# Patient Record
Sex: Female | Born: 1990 | Race: Black or African American | Hispanic: No | Marital: Single | Smoking: Never smoker
Health system: Southern US, Community
[De-identification: ages and names within clinical notes are randomized; demographics above are authoritative.]

## PROBLEM LIST (undated history)

## (undated) DIAGNOSIS — J45909 Unspecified asthma, uncomplicated: Secondary | ICD-10-CM

## (undated) DIAGNOSIS — E119 Type 2 diabetes mellitus without complications: Secondary | ICD-10-CM

## (undated) HISTORY — PX: ADENOIDECTOMY: SUR15

## (undated) HISTORY — PX: TONSILLECTOMY: SUR1361

## (undated) HISTORY — PX: BACK SURGERY: SHX140

---
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
## 2011-01-19 ENCOUNTER — Encounter: Payer: Self-pay | Admitting: Obstetrics

## 2011-04-08 LAB — DIFFERENTIAL
Eosinophils Absolute: 0.3 10*3/uL (ref 0.0–1.2)
Eosinophils Relative: 5 % (ref 0–5)
Monocytes Absolute: 0.2 10*3/uL (ref 0.2–1.2)

## 2011-04-08 LAB — CBC
HCT: 37.6 % (ref 36.0–49.0)
Hemoglobin: 12.3 g/dL (ref 12.0–16.0)
MCHC: 32.8 g/dL (ref 31.0–37.0)
MCV: 69.1 fL — ABNORMAL LOW (ref 78.0–98.0)
Platelets: 349 10*3/uL (ref 150–400)
RBC: 5.44 MIL/uL (ref 3.80–5.70)
RDW: 15.5 % (ref 11.4–15.5)
WBC: 6.7 10*3/uL (ref 4.5–13.5)

## 2011-04-08 LAB — POCT URINALYSIS DIP (DEVICE)
Glucose, UA: NEGATIVE mg/dL
Protein, ur: NEGATIVE mg/dL
Specific Gravity, Urine: 1.02 (ref 1.005–1.030)

## 2011-04-08 LAB — POCT PREGNANCY, URINE: Preg Test, Ur: NEGATIVE

## 2011-05-13 NOTE — Op Note (Signed)
NAMEGEORGANN, Sheila Alvarez              ACCOUNT NO.:  [id number]   MEDICAL RECORD NO.:  [id number]          PATIENT TYPE:  OIB   LOCATION:  2550                         FACILITY:  [facility]   PHYSICIAN:  Lucky Cowboy, MD         DATE OF BIRTH:  January 11, 1991   DATE OF PROCEDURE:  10/01/2007  DATE OF DISCHARGE:                               OPERATIVE REPORT   PREOPERATIVE DIAGNOSIS:  Obstructive sleep apnea, obstructing inferior  turbinate hypertrophy, adenotonsillar hypertrophy.   POSTOPERATIVE DIAGNOSIS:  Obstructive sleep apnea, obstructing inferior  turbinate hypertrophy, adenotonsillar hypertrophy.   PROCEDURE:  Adenotonsillectomy, submucosal resection bilateral inferior  turbinates.   SURGEON:  Lucky Cowboy, M.D.   ANESTHESIA:  General.   ESTIMATED BLOOD LOSS:  Less than 30 mL.   SPECIMENS:  Tonsils and adenoids.   COMPLICATIONS:  None.   INDICATIONS:  The patient is a 20 year old female who was having  constant nasal obstruction with profuse clear rhinorrhea.  She is going  through three boxes of Kleenex per day.  Although she is overweight at  250 pounds, which certainly could contribute to the obstructive sleep  apnea, she also has significant adenotonsillar hypertrophy.  For these  reasons, adenotonsillectomy along with turbinate reduction were  recommended.   FINDINGS:  The patient was noted have a moderate amount of adenoid  hypertrophy with deep seated exudates.  Tonsils were 3+ with deep  exudate, as well, in the cryptic spaces.  There was very profuse  inferior middle turbinate edema consistent with severe allergic rhinitis  or some type of rhinitis.  There is no evidence of infection.  There was  a significant bony component to both of the inferior turbinates.   OPERATIVE PROCEDURE:  The patient was taken to the operating room and  placed on the table in the supine position.  She was then placed under  general endotracheal anesthesia and the table rotated counter  clockwise  90 degrees.  Her head and body were draped.  A Crowe-Davis mouth gag  with a #4 tongue blade was then placed intraorally, opened, and  suspended on the Mayo stand.  Palpation of the soft palate was without  evidence of submucosal cleft.  A red rubber catheter was placed down the  left nostril, brought out through the oral cavity, and secured in place  with a hemostat.  A large adenoid curet was placed against the vomer and  directed inferiorly with subsequent passes severing the adenoid pad.  Two sterile gauze Afrin soaked packs were placed in the nasopharynx and  the palate relaxed.   The right palatine tonsil was grasped with Allis clamps and directed  inferomedially while the Bovie cautery was used to dissect the tonsil  away from the superior pharyngeal constrictor.  The left palatine tonsil  was removed in an identical fashion.  The palate was re-elevated and the  packs removed.  Suction cautery was used for hemostasis.  The  nasopharynx was copiously irrigated with normal saline which was  suctioned out through an oral cavity.  An NG tube was placed down the  esophagus for suctioning  of the gastric contents.  The mouth gag was  removed noting no damage to the teeth or soft tissues.   The table was rotated clockwise 90 degrees and inferior turbinectomy  performed.  Both nasal cavities were decongested with Afrin on cottonoid  pledgets.  After allowing time for vasoconstrictive effect, which was  quite significant, 1% lidocaine with 1:100,000 epinephrine was used to  inject both the inferior turbinates.  Again allowing time for medication  effect, the straight shot debrider was used to perform a lateral power  turbinectomy by removing the mucosa on the lateral side of the inferior  turbinate throughout its length and also removing the inferior turbinate  bone.  This was done bilaterally and the residual soft tissue portion of  the turbinate rolled around under the remnant  of the bone to promote  normal healing.  Excessive bone which was residual prior to this was  taken down using Tru-Cut forceps.  Surgicel was placed to help the  mucosa stay in the desired position.  Each of the nasal cavities was  packed with Telfa coated with bacitracin ointment.  The packs were tied  to one another anterior to the columella.   An NG tube was placed down the esophagus for suctioning of the gastric  contents.  The mouth gag was removed noting no damage to the teeth or  soft tissues.  The patient was awakened from anesthesia and taken to the  post anesthesia care unit in stable condition.  There were no  complications.  She will be observed overnight on the pediatric floor.      Lucky Cowboy, MD  Electronically Signed     SJ/MEDQ  D:  10/01/2007  T:  10/02/2007  Job:  [id number]   cc:   Fonnie Mu, M.D.  Roselyn M. Willa Rough, M.D.

## 2011-10-09 LAB — CBC: MCHC: 32.7

## 2012-07-08 ENCOUNTER — Encounter (HOSPITAL_COMMUNITY): Payer: Self-pay | Admitting: Emergency Medicine

## 2012-07-08 ENCOUNTER — Emergency Department (HOSPITAL_COMMUNITY): Payer: Self-pay

## 2012-07-08 ENCOUNTER — Emergency Department (HOSPITAL_COMMUNITY)
Admission: EM | Admit: 2012-07-08 | Discharge: 2012-07-08 | Disposition: A | Discharge status: Home / Self Care | Payer: Self-pay | Attending: Emergency Medicine | Admitting: Emergency Medicine

## 2012-07-08 DIAGNOSIS — M25569 Pain in unspecified knee: Secondary | ICD-10-CM | POA: Insufficient documentation

## 2012-07-08 DIAGNOSIS — M25561 Pain in right knee: Secondary | ICD-10-CM

## 2012-07-08 MED ORDER — NAPROXEN 500 MG PO TABS: 500.0000 mg | ORAL_TABLET | Freq: Two times a day (BID) | ORAL | Status: DC

## 2012-07-08 NOTE — ED Notes (Signed)
Pt reports knee pain x 1 week. Denies trauma. Did not self medicate

## 2012-07-08 NOTE — ED Provider Notes (Signed)
Medical screening examination/treatment/procedure(s) were performed by non-physician practitioner and as supervising physician I was immediately available for consultation/collaboration.  Waynesha Rammel K Linker, MD 07/08/12 1611 

## 2012-07-08 NOTE — ED Provider Notes (Signed)
History     CSN: [id number]  Arrival date & time 07/08/12  1328   First MD Initiated Contact with Patient 07/08/12 1346      Chief Complaint  Patient presents with  . Knee Pain    r/knee pain x 1 week. denies trauma    (Consider location/radiation/quality/duration/timing/severity/associated sxs/prior treatment) Patient is a 21 y.o. female presenting with knee pain. The history is provided by the patient.  Knee Pain This is a new problem. The current episode started 1 to 4 weeks ago. The problem occurs constantly. The problem has been gradually worsening. Pertinent negatives include no arthralgias, chills, fever, joint swelling, numbness, rash or weakness. The symptoms are aggravated by bending and standing. She has tried nothing for the symptoms.  Pt states pain mainly behind the knee. No swelling or pain in the foot or calf.   No past medical history on file.  No past surgical history on file.  No family history on file.  History  Substance Use Topics  . Smoking status: Not on file  . Smokeless tobacco: Not on file  . Alcohol Use: Not on file    OB History    No data available      Review of Systems  Constitutional: Negative for fever and chills.  Respiratory: Negative.   Cardiovascular: Negative.   Musculoskeletal: Negative for joint swelling and arthralgias.  Skin: Negative for color change and rash.  Neurological: Negative for weakness and numbness.    Allergies  Review of patient's allergies indicates no known allergies.  Home Medications  No current outpatient prescriptions on file.  There were no vitals taken for this visit.  Physical Exam  Nursing note and vitals reviewed. Constitutional: She is oriented to person, place, and time. She appears well-developed and well-nourished. No distress.  Eyes: Conjunctivae are normal.  Cardiovascular: Normal rate, regular rhythm and normal heart sounds.   Pulmonary/Chest: Effort normal and breath sounds normal.  No respiratory distress. She has no wheezes. She has no rales.  Musculoskeletal:       Normal appearing right knee with no bruising, swelling, redness. Tender to the posterior knee. Full ROM. Pain with full extension and full flexion. No pain in the ankle, calf, foot. Joint stable with negative anterior, posterior drawer sign. No laxity or pain with medial or latera stress  Neurological: She is alert and oriented to person, place, and time.  Skin: Skin is warm and dry.  Psychiatric: She has a normal mood and affect.    ED Course  Procedures (including critical care time)  Pt with non traumatic knee pain for 1 week, did not take any medications.   Dg Knee Complete 4 Views Right  07/08/2012  *RADIOLOGY REPORT*  Clinical Data: Posterior right knee pain for 1 week, no known injury  RIGHT KNEE - COMPLETE 4+ VIEW  Comparison: None  Findings: Osseous mineralization grossly normal for technique. Question minimal medial compartment joint space narrowing. No acute fracture, dislocation or bone destruction. No definite knee joint effusion. Regional soft tissues unremarkable.  IMPRESSION: No acute osseous abnormalities. Question minimal medial compartment joint space narrowing.  Original Report Authenticated By: Lollie Marrow, M.D.   X-ray negative. Pt in NAD. No signs of infection. I got a Child psychotherapist to come speak with pt about getting a primary care doctor, resources given. Will d/c home with ACE wrap, anti inflammatories, follow up.   1. Knee pain, right       MDM  Lottie Mussel, [place] 07/08/12 [phone]

## 2012-12-18 ENCOUNTER — Emergency Department (HOSPITAL_COMMUNITY)
Admission: EM | Admit: 2012-12-18 | Discharge: 2012-12-18 | Disposition: A | Discharge status: Home / Self Care | Payer: Self-pay | Attending: Emergency Medicine | Admitting: Emergency Medicine

## 2012-12-18 ENCOUNTER — Encounter (HOSPITAL_COMMUNITY): Payer: Self-pay | Admitting: Emergency Medicine

## 2012-12-18 DIAGNOSIS — H9190 Unspecified hearing loss, unspecified ear: Secondary | ICD-10-CM | POA: Insufficient documentation

## 2012-12-18 DIAGNOSIS — J3489 Other specified disorders of nose and nasal sinuses: Secondary | ICD-10-CM | POA: Insufficient documentation

## 2012-12-18 DIAGNOSIS — H9209 Otalgia, unspecified ear: Secondary | ICD-10-CM | POA: Insufficient documentation

## 2012-12-18 DIAGNOSIS — H6690 Otitis media, unspecified, unspecified ear: Secondary | ICD-10-CM | POA: Insufficient documentation

## 2012-12-18 DIAGNOSIS — H9319 Tinnitus, unspecified ear: Secondary | ICD-10-CM | POA: Insufficient documentation

## 2012-12-18 MED ORDER — AMOXICILLIN 500 MG PO CAPS: 500.0000 mg | ORAL_CAPSULE | Freq: Three times a day (TID) | ORAL | Status: DC

## 2012-12-18 NOTE — ED Provider Notes (Signed)
History     CSN: [id number]  Arrival date & time 12/18/12  0865   First MD Initiated Contact with Patient 12/18/12 [phone]      Chief Complaint  Patient presents with  . Ear Fullness    (Consider location/radiation/quality/duration/timing/severity/associated sxs/prior treatment) HPI Comments: Patient presents with full feeling to the left ear with some remaining in the ear. She states it started about 3 days ago with some pain to the left ear and she was using sweet oil and had flushed her ear out and now she feels fullness and ringing in her left ear. She has had some recent URI type symptoms but denies any congestion currently. Denies any fevers or chills. Denies any drainage from her ear.  Denies known trauma to ear.  Patient is a 21 y.o. female presenting with plugged ear sensation.  Ear Fullness Pertinent negatives include no chest pain, no abdominal pain, no headaches and no shortness of breath.    History reviewed. No pertinent past medical history.  Past Surgical History  Procedure Date  . Back surgery   . Tonsillectomy   . Adenoidectomy     Family History  Problem Relation Age of Onset  . Diabetes Mother   . Hypertension Mother   . Cancer Mother     History  Substance Use Topics  . Smoking status: Never Smoker   . Smokeless tobacco: Never Used  . Alcohol Use: Yes     Comment: ocassionally - vodka    OB History    Grav Para Term Preterm Abortions TAB SAB Ect Mult Living                  Review of Systems  Constitutional: Negative for fever, chills, diaphoresis and fatigue.  HENT: Positive for hearing loss, ear pain, congestion, rhinorrhea, postnasal drip and tinnitus. Negative for sneezing.   Eyes: Negative.   Respiratory: Negative for cough, chest tightness and shortness of breath.   Cardiovascular: Negative for chest pain and leg swelling.  Gastrointestinal: Negative for nausea, vomiting, abdominal pain, diarrhea and blood in stool.  Genitourinary:  Negative for frequency, hematuria, flank pain and difficulty urinating.  Musculoskeletal: Negative for back pain and arthralgias.  Skin: Negative for rash.  Neurological: Negative for dizziness, speech difficulty, weakness, numbness and headaches.    Allergies  Review of patient's allergies indicates no known allergies.  Home Medications   Current Outpatient Rx  Name  Route  Sig  Dispense  Refill  . ACETAMINOPHEN 500 MG PO TABS   Oral   Take 1,000 mg by mouth every 6 (six) hours as needed. For pain.         Marland Kitchen AMOXICILLIN 500 MG PO CAPS   Oral   Take 1 capsule (500 mg total) by mouth 3 (three) times daily.   21 capsule   0     BP 112/74  Pulse 85  Temp 97.8 F (36.6 C) (Oral)  Resp 16  SpO2 99%  LMP 12/04/2012  Physical Exam  Constitutional: She is oriented to person, place, and time. She appears well-developed and well-nourished.  HENT:  Head: Normocephalic and atraumatic.  Mouth/Throat: Oropharynx is clear and moist.       Left ear with some clear fluid in canal, no inflammation of canal.  No pain over mastoid/tragus.  No elevation of pinna.  No rash.  +erythema to TM, I cannot see entire TM due to wax  Eyes: Pupils are equal, round, and reactive to light.  Neck: Normal range  of motion. Neck supple.  Cardiovascular: Normal rate.   Pulmonary/Chest: Effort normal and breath sounds normal. No respiratory distress.  Musculoskeletal: Normal range of motion. She exhibits no edema.  Lymphadenopathy:    She has no cervical adenopathy.  Neurological: She is alert and oriented to person, place, and time.  Skin: Skin is warm and dry. No rash noted.  Psychiatric: She has a normal mood and affect.    ED Course  Procedures (including critical care time)  Labs Reviewed - No data to display No results found.   1. Otitis media       MDM  Pt with OM, cannot completely evaluate TM for rupture. Will start amoxil, advised pt to avoid getting water in ear.  Advised pt  that she will need to have a reevaluation to assess TM completely and hearing test if hearing does not resolve.  Will refer to ENT        Rolan Bucco, MD 12/18/12 [phone]

## 2012-12-18 NOTE — ED Notes (Signed)
Pt presents w/ inability to hear out of left ear, initially had left ear pain which she treated w/ OTC, Sweet Oil. No longer has pain in ear, states ear is ringing.

## 2013-12-16 ENCOUNTER — Emergency Department (HOSPITAL_COMMUNITY)
Admission: EM | Admit: 2013-12-16 | Discharge: 2013-12-16 | Disposition: A | Discharge status: Home / Self Care | Payer: Self-pay | Attending: Emergency Medicine | Admitting: Emergency Medicine

## 2013-12-16 ENCOUNTER — Encounter (HOSPITAL_COMMUNITY): Payer: Self-pay | Admitting: Emergency Medicine

## 2013-12-16 DIAGNOSIS — R110 Nausea: Secondary | ICD-10-CM | POA: Insufficient documentation

## 2013-12-16 DIAGNOSIS — R079 Chest pain, unspecified: Secondary | ICD-10-CM | POA: Insufficient documentation

## 2013-12-16 DIAGNOSIS — Z9889 Other specified postprocedural states: Secondary | ICD-10-CM | POA: Insufficient documentation

## 2013-12-16 DIAGNOSIS — M546 Pain in thoracic spine: Secondary | ICD-10-CM | POA: Insufficient documentation

## 2013-12-16 DIAGNOSIS — R0781 Pleurodynia: Secondary | ICD-10-CM

## 2013-12-16 DIAGNOSIS — J45901 Unspecified asthma with (acute) exacerbation: Secondary | ICD-10-CM | POA: Insufficient documentation

## 2013-12-16 DIAGNOSIS — M545 Low back pain, unspecified: Secondary | ICD-10-CM | POA: Insufficient documentation

## 2013-12-16 DIAGNOSIS — G8929 Other chronic pain: Secondary | ICD-10-CM | POA: Insufficient documentation

## 2013-12-16 HISTORY — DX: Unspecified asthma, uncomplicated: J45.909

## 2013-12-16 MED ORDER — IBUPROFEN 600 MG PO TABS: 600.0000 mg | ORAL_TABLET | Freq: Four times a day (QID) | ORAL | Status: DC | PRN

## 2013-12-16 MED ORDER — IBUPROFEN 400 MG PO TABS
800.0000 mg | ORAL_TABLET | Freq: Once | ORAL | Status: AC
  Administered 2013-12-16: 800 mg via ORAL
  Filled 2013-12-16: qty 2

## 2013-12-16 MED ORDER — CYCLOBENZAPRINE HCL 10 MG PO TABS: 10.0000 mg | ORAL_TABLET | Freq: Two times a day (BID) | ORAL | Status: DC | PRN

## 2013-12-16 MED ORDER — TRAMADOL HCL 50 MG PO TABS
50.0000 mg | ORAL_TABLET | Freq: Once | ORAL | Status: AC
  Administered 2013-12-16: 50 mg via ORAL
  Filled 2013-12-16: qty 1

## 2013-12-16 MED ORDER — DIAZEPAM 5 MG PO TABS
5.0000 mg | ORAL_TABLET | Freq: Once | ORAL | Status: AC
  Administered 2013-12-16: 5 mg via ORAL
  Filled 2013-12-16: qty 1

## 2013-12-16 NOTE — ED Notes (Signed)
Lower back pain   For  A few years , rib pain both side under both breast started last night denies any injury no cough

## 2013-12-16 NOTE — ED Provider Notes (Signed)
Medical screening examination/treatment/procedure(s) were performed by non-physician practitioner and as supervising physician I was immediately available for consultation/collaboration.  EKG Interpretation   None         Gwyneth Sprout, MD 12/16/13 1529

## 2013-12-16 NOTE — ED Notes (Signed)
Pt reports upper back pain ongoing for some time. Pt reports B/L rib pain onset past couple days. Pt denies cold symptoms or heavy lifting.

## 2013-12-16 NOTE — ED Provider Notes (Signed)
CSN: [id number]     Arrival date & time 12/16/13  4098 History  This chart was scribed for non-physician practitioner Junius Finner, PA-C, working with Gwyneth Sprout, MD by Dorothey Baseman, ED Scribe. This patient was seen in room TR08C/TR08C and the patient's care was started at 9:52 AM.    Chief Complaint  Patient presents with  . Back Pain  . Chest Pain   The history is provided by the patient. No language interpreter was used.   HPI Comments: Sheila Alvarez is a 22 y.o. female who presents to the Emergency Department complaining of an intermittent pain to the upper back, 7/10 currently, that has been ongoing for a while secondary to a back surgery that was performed about 4-5 years ago. She reports that the pain has been progressively worsening for the past few weeks. She reports some associated shortness of breath and nausea secondary to the pain. Patient denies any recent potential injury or trauma to the area or recent heavy lifting. She reports applying heat to the area and taking Tylenol at home without relief. She denies any neck pain, numbness, weakness, bowel or bladder incontinence, fever, emesis, or diarrhea. Patient also reports a constant pain to the bilateral ribs onset a few days ago. She denies any allergies to medications. Patient has no other pertinent medical history.    Past Medical History  Diagnosis Date  . Asthma    Past Surgical History  Procedure Laterality Date  . Back surgery    . Tonsillectomy    . Adenoidectomy     Family History  Problem Relation Age of Onset  . Diabetes Mother   . Hypertension Mother   . Cancer Mother    History  Substance Use Topics  . Smoking status: Never Smoker   . Smokeless tobacco: Never Used  . Alcohol Use: Yes     Comment: ocassionally - vodka   OB History   Grav Para Term Preterm Abortions TAB SAB Ect Mult Living                 Review of Systems  Constitutional: Negative for fever.  Respiratory: Positive for  shortness of breath.        Rib pain  Gastrointestinal: Positive for nausea. Negative for vomiting and diarrhea.  Musculoskeletal: Positive for back pain. Negative for neck pain.  Neurological: Negative for weakness and numbness.  All other systems reviewed and are negative.    Allergies  Review of patient's allergies indicates no known allergies.  Home Medications   Current Outpatient Rx  Name  Route  Sig  Dispense  Refill  . acetaminophen (TYLENOL) 500 MG tablet   Oral   Take 1,000 mg by mouth every 6 (six) hours as needed. For pain.         . cyclobenzaprine (FLEXERIL) 10 MG tablet   Oral   Take 1 tablet (10 mg total) by mouth 2 (two) times daily as needed for muscle spasms.   15 tablet   0   . ibuprofen (ADVIL,MOTRIN) 600 MG tablet   Oral   Take 1 tablet (600 mg total) by mouth every 6 (six) hours as needed.   30 tablet   0    Triage Vitals: BP 148/86  Pulse 82  Temp(Src) 98 F (36.7 C) (Oral)  Resp 20  Ht 5\' 4"  (1.626 m)  Wt 278 lb 9 oz (126.355 kg)  BMI 47.79 kg/m2  SpO2 100%  LMP 12/02/2013  Physical Exam  Nursing note  and vitals reviewed. Constitutional: She is oriented to person, place, and time. She appears well-developed and well-nourished.  Morbidly obese.  HENT:  Head: Normocephalic and atraumatic.  Eyes: EOM are normal.  Neck: Normal range of motion.  Cardiovascular: Normal rate.   Pulmonary/Chest: Effort normal. She exhibits tenderness.  Chest wall tenderness along the bilateral lower ribs. No crepitus.   Musculoskeletal: Normal range of motion.  Tender along the thoracic and lumbar paraspinal muscles.   Neurological: She is alert and oriented to person, place, and time.  Antalgic gait. No ataxia.  Skin: Skin is warm and dry. No erythema.  Well-healed surgical scar over lumbar spine. No erythema or warmth.   Psychiatric: She has a normal mood and affect. Her behavior is normal.    ED Course  Procedures (including critical care  time)  DIAGNOSTIC STUDIES: Oxygen Saturation is 100% on room air, normal by my interpretation.    COORDINATION OF CARE: 9:56 PM- Will discharge patient with flexeril and ibuprofen to manage symptoms. Advised patient to follow up with the referred orthopedic specialist. Return precautions given. Discussed treatment plan with patient at bedside and patient verbalized agreement.     Labs Review Labs Reviewed - No data to display Imaging Review No results found.  EKG Interpretation   None       MDM   1. Rib pain   2. Chronic back pain    Pt is morbidly obese, remote hx of spinal surgery, 4-5 years ago. Reports pain since time of surgery, recently worsened, in process of establishing insurance and f/u with orthopedist.  No red flag symptoms.  Denies fever, n/v/d, change in bowel or bladder habits. No numbness or tingling in legs. No ataxia. Denies hx of trauma or fall. Do not believe imaging would be of benefit at this time.  Will tx pain in ED. Return precautions provided.   I personally performed the services described in this documentation, which was scribed in my presence. The recorded information has been reviewed and is accurate.     Junius Finner, PA-C 12/16/13 1042

## 2014-06-30 ENCOUNTER — Emergency Department (HOSPITAL_COMMUNITY)
Admission: EM | Admit: 2014-06-30 | Discharge: 2014-06-30 | Disposition: A | Discharge status: Home / Self Care | Payer: Self-pay | Attending: Emergency Medicine | Admitting: Emergency Medicine

## 2014-06-30 ENCOUNTER — Encounter (HOSPITAL_COMMUNITY): Payer: Self-pay | Admitting: Emergency Medicine

## 2014-06-30 DIAGNOSIS — R51 Headache: Secondary | ICD-10-CM | POA: Insufficient documentation

## 2014-06-30 DIAGNOSIS — R519 Headache, unspecified: Secondary | ICD-10-CM

## 2014-06-30 DIAGNOSIS — J45909 Unspecified asthma, uncomplicated: Secondary | ICD-10-CM | POA: Insufficient documentation

## 2014-06-30 DIAGNOSIS — M436 Torticollis: Secondary | ICD-10-CM | POA: Insufficient documentation

## 2014-06-30 MED ORDER — OXYCODONE-ACETAMINOPHEN 5-325 MG PO TABS
1.0000 | ORAL_TABLET | Freq: Once | ORAL | Status: AC
  Administered 2014-06-30: 1 via ORAL
  Filled 2014-06-30: qty 1

## 2014-06-30 MED ORDER — IBUPROFEN 800 MG PO TABS
800.0000 mg | ORAL_TABLET | Freq: Once | ORAL | Status: AC
  Administered 2014-06-30: 800 mg via ORAL
  Filled 2014-06-30: qty 1

## 2014-06-30 MED ORDER — IBUPROFEN 600 MG PO TABS: 600.0000 mg | ORAL_TABLET | Freq: Four times a day (QID) | ORAL | Status: DC | PRN

## 2014-06-30 MED ORDER — DIAZEPAM 5 MG PO TABS
5.0000 mg | ORAL_TABLET | Freq: Once | ORAL | Status: AC
  Administered 2014-06-30: 5 mg via ORAL
  Filled 2014-06-30: qty 1

## 2014-06-30 NOTE — Discharge Instructions (Signed)
Return to the ED with any concerns including fever/chills, vomiting and not able to keep down liquids, changes in vision or speech, weakness of arm or leg, decreased level of alertness/lethargy, or any other alarming symptoms

## 2014-06-30 NOTE — ED Notes (Signed)
Pt c/o "pressure" to back of head x 3 days. Denies n/v/d, denies fever. +photophobia.

## 2014-06-30 NOTE — ED Provider Notes (Signed)
CSN: [id number]     Arrival date & time 06/30/14  2027 History   First MD Initiated Contact with Patient 06/30/14 2108     Chief Complaint  Patient presents with  . Headache     (Consider location/radiation/quality/duration/timing/severity/associated sxs/prior Treatment) HPI Pt presenting with c/o headache and right sided neck pain.  Pt states she woke up with pain when turning her head to the right.  She states this is causing a throbbing headache up the back of her head.  No fever/chills.  No vomiting.  No changes in vision.  Pt describes pain as gradual in onset.  No focal weakness or numnbess.  LMP 1 week ago.  No hx of DVT/PE, no recent pregnancy.  There are no other associated systemic symptoms, there are no other alleviating or modifying factors.   Past Medical History  Diagnosis Date  . Asthma    Past Surgical History  Procedure Laterality Date  . Back surgery    . Tonsillectomy    . Adenoidectomy     Family History  Problem Relation Age of Onset  . Diabetes Mother   . Hypertension Mother   . Cancer Mother    History  Substance Use Topics  . Smoking status: Never Smoker   . Smokeless tobacco: Never Used  . Alcohol Use: Yes     Comment: ocassionally - vodka   OB History   Grav Para Term Preterm Abortions TAB SAB Ect Mult Living                 Review of Systems ROS reviewed and all otherwise negative except for mentioned in HPI    Allergies  Review of patient's allergies indicates no known allergies.  Home Medications   Prior to Admission medications   Medication Sig Start Date End Date Taking? Authorizing Provider  ibuprofen (ADVIL,MOTRIN) 600 MG tablet Take 1 tablet (600 mg total) by mouth every 6 (six) hours as needed. 12/16/13  Yes Junius FinnerErin O'Malley, PA-C  ibuprofen (ADVIL,MOTRIN) 600 MG tablet Take 1 tablet (600 mg total) by mouth every 6 (six) hours as needed. 06/30/14   Ethelda ChickMartha K Linker, MD   BP 140/86  Pulse 84  Temp(Src) 98.1 F (36.7 C) (Oral)   Resp 18  Ht 5\' 4"  (1.626 m)  Wt 250 lb (113.399 kg)  BMI 42.89 kg/m2  SpO2 99%  LMP 06/23/2014 Vitals reviewed Physical Exam Physical Examination: General appearance - alert, well appearing, and in no distress Mental status - alert, oriented to person, place, and time Eyes - no conjunctival injection, no scleral icterus Mouth - mucous membranes moist, pharynx normal without lesions Neck - supple, no significant adenopathy, ttp over right paraspinous muscles on right side, some pain with turning head to right, no meningismus Chest - clear to auscultation, no wheezes, rales or rhonchi, symmetric air entry Heart - normal rate, regular rhythm, normal S1, S2, no murmurs, rubs, clicks or gallops Abdomen - soft, nontender, nondistended, no masses or organomegaly Neurological - alert, oriented x 3, cranial nerves 2-12 tested and intact, strength 5/5 in extremities x 4, sensation intact Extremities - peripheral pulses normal, no pedal edema, no clubbing or cyanosis Skin - normal coloration and turgor, no rashes  ED Course  Procedures (including critical care time)  10:47 PM pt has had resolution of her headache, she is requesting discharge.  Labs Review Labs Reviewed - No data to display  Imaging Review No results found.   EKG Interpretation None      MDM  Final diagnoses:  Headache, unspecified headache type  Torticollis    Pt presenting with c/o headache, seems to be originating from pain in right side of neck over paraspinous muscles, pt not able to turn head to right without pain in neck.  Suspect torticollis, doubt meningitis, SAH or other acute emergent cause of headache.  Pt had complete resolution of symptoms after meds in the ED and was requesting discharge.  Discharged with strict return precautions.  Pt agreeable with plan.  Nursing notes including past medical history and social history reviewed and considered in documentation Prior records reviewed and considered  during this visit     Ethelda ChickMartha K Linker, MD 07/01/14 [phone]

## 2014-09-24 ENCOUNTER — Encounter (HOSPITAL_COMMUNITY): Payer: Self-pay | Admitting: Emergency Medicine

## 2014-09-24 ENCOUNTER — Emergency Department (HOSPITAL_COMMUNITY)
Admission: EM | Admit: 2014-09-24 | Discharge: 2014-09-24 | Disposition: A | Discharge status: Home / Self Care | Payer: Self-pay | Attending: Emergency Medicine | Admitting: Emergency Medicine

## 2014-09-24 ENCOUNTER — Emergency Department (HOSPITAL_COMMUNITY): Payer: Self-pay

## 2014-09-24 DIAGNOSIS — M549 Dorsalgia, unspecified: Secondary | ICD-10-CM | POA: Insufficient documentation

## 2014-09-24 DIAGNOSIS — R071 Chest pain on breathing: Secondary | ICD-10-CM | POA: Insufficient documentation

## 2014-09-24 DIAGNOSIS — R079 Chest pain, unspecified: Secondary | ICD-10-CM | POA: Insufficient documentation

## 2014-09-24 DIAGNOSIS — R0789 Other chest pain: Secondary | ICD-10-CM

## 2014-09-24 DIAGNOSIS — J45909 Unspecified asthma, uncomplicated: Secondary | ICD-10-CM | POA: Insufficient documentation

## 2014-09-24 LAB — URINALYSIS, ROUTINE W REFLEX MICROSCOPIC
Bilirubin Urine: NEGATIVE
GLUCOSE, UA: NEGATIVE mg/dL
Hgb urine dipstick: NEGATIVE
Ketones, ur: NEGATIVE mg/dL
Leukocytes, UA: NEGATIVE
Nitrite: NEGATIVE
PROTEIN: NEGATIVE mg/dL
SPECIFIC GRAVITY, URINE: 1.015 (ref 1.005–1.030)
Urobilinogen, UA: 1 mg/dL (ref 0.0–1.0)
pH: 6 (ref 5.0–8.0)

## 2014-09-24 LAB — BASIC METABOLIC PANEL
ANION GAP: 11 (ref 5–15)
BUN: 10 mg/dL (ref 6–23)
CO2: 26 mEq/L (ref 19–32)
Calcium: 9.4 mg/dL (ref 8.4–10.5)
Chloride: 99 mEq/L (ref 96–112)
Creatinine, Ser: 0.65 mg/dL (ref 0.50–1.10)
GFR calc Af Amer: >90 mL/min (ref >90)
GLUCOSE: 93 mg/dL (ref 70–99)
Potassium: 4.4 mEq/L (ref 3.7–5.3)
Sodium: 136 mEq/L — ABNORMAL LOW (ref 137–147)

## 2014-09-24 LAB — CBC WITH DIFFERENTIAL/PLATELET
BASOS ABS: 0 10*3/uL (ref 0.0–0.1)
Basophils Relative: 0 % (ref 0–1)
Eosinophils Absolute: 0.4 10*3/uL (ref 0.0–0.7)
Eosinophils Relative: 7 % — ABNORMAL HIGH (ref 0–5)
HEMATOCRIT: 40.8 % (ref 36.0–46.0)
Hemoglobin: 14.2 g/dL (ref 12.0–15.0)
Lymphocytes Relative: 26 % (ref 12–46)
Lymphs Abs: 1.4 10*3/uL (ref 0.7–4.0)
MCH: 25.6 pg — AB (ref 26.0–34.0)
MCHC: 34.8 g/dL (ref 30.0–36.0)
MCV: 73.5 fL — AB (ref 78.0–100.0)
MONOS PCT: 4 % (ref 3–12)
Monocytes Absolute: 0.2 10*3/uL (ref 0.1–1.0)
NEUTROS ABS: 3.3 10*3/uL (ref 1.7–7.7)
Neutrophils Relative %: 63 % (ref 43–77)
PLATELETS: 254 10*3/uL (ref 150–400)
RBC: 5.55 MIL/uL — ABNORMAL HIGH (ref 3.87–5.11)
RDW: 13.3 % (ref 11.5–15.5)
WBC: 5.2 10*3/uL (ref 4.0–10.5)

## 2014-09-24 LAB — TROPONIN I

## 2014-09-24 LAB — PREGNANCY, URINE: Preg Test, Ur: NEGATIVE

## 2014-09-24 MED ORDER — HYDROCODONE-ACETAMINOPHEN 5-325 MG PO TABS
2.0000 | ORAL_TABLET | Freq: Once | ORAL | Status: AC
  Administered 2014-09-24: 2 via ORAL
  Filled 2014-09-24: qty 2

## 2014-09-24 MED ORDER — HYDROCODONE-ACETAMINOPHEN 5-325 MG PO TABS: 2.0000 | ORAL_TABLET | ORAL | Status: DC | PRN

## 2014-09-24 MED ORDER — IBUPROFEN 800 MG PO TABS: 800.0000 mg | ORAL_TABLET | Freq: Three times a day (TID) | ORAL | Status: DC

## 2014-09-24 NOTE — ED Provider Notes (Signed)
CSN: [id number]     Arrival date & time 09/24/14  0807 History   First MD Initiated Contact with Patient 09/24/14 0820     Chief Complaint  Patient presents with  . Back Pain  . Chest Pain     (Consider location/radiation/quality/duration/timing/severity/associated sxs/prior Treatment) Patient is a 23 y.o. female presenting with chest pain. The history is provided by the patient. No language interpreter was used.  Chest Pain Pain location:  L chest Pain quality: aching   Pain radiates to:  Precordial region Pain radiates to the back: no   Pain severity:  Moderate Onset quality:  Gradual Timing:  Constant Progression:  Worsening Chronicity:  New Context: movement   Relieved by:  Nothing Worsened by:  Nothing tried Ineffective treatments:  None tried Associated symptoms: back pain   Associated symptoms: no cough and no diaphoresis   Risk factors: no hypertension     Past Medical History  Diagnosis Date  . Asthma    Past Surgical History  Procedure Laterality Date  . Back surgery    . Tonsillectomy    . Adenoidectomy     Family History  Problem Relation Age of Onset  . Diabetes Mother   . Hypertension Mother   . Cancer Mother    History  Substance Use Topics  . Smoking status: Never Smoker   . Smokeless tobacco: Never Used  . Alcohol Use: Yes     Comment: ocassionally - vodka   OB History   Grav Para Term Preterm Abortions TAB SAB Ect Mult Living                 Review of Systems  Constitutional: Negative for diaphoresis.  Respiratory: Negative for cough.   Cardiovascular: Positive for chest pain.  Musculoskeletal: Positive for back pain.  All other systems reviewed and are negative.     Allergies  Review of patient's allergies indicates no known allergies.  Home Medications   Prior to Admission medications   Medication Sig Start Date End Date Taking? Authorizing Provider  ibuprofen (ADVIL,MOTRIN) 600 MG tablet Take 1 tablet (600 mg total) by  mouth every 6 (six) hours as needed. 12/16/13   Junius Finner, PA-C  ibuprofen (ADVIL,MOTRIN) 600 MG tablet Take 1 tablet (600 mg total) by mouth every 6 (six) hours as needed. 06/30/14   Ethelda Chick, MD   LMP 09/17/2014 Physical Exam  Nursing note and vitals reviewed. Constitutional: She is oriented to person, place, and time. She appears well-developed and well-nourished.  HENT:  Head: Normocephalic and atraumatic.  Mouth/Throat: Oropharynx is clear and moist.  Eyes: Conjunctivae and EOM are normal. Pupils are equal, round, and reactive to light.  Neck: Normal range of motion.  Cardiovascular: Normal rate, regular rhythm and normal heart sounds.   Pulmonary/Chest: Effort normal and breath sounds normal. She exhibits tenderness.  Abdominal: Soft. She exhibits no distension.  Musculoskeletal: Normal range of motion.  Neurological: She is alert and oriented to person, place, and time.  Skin: Skin is warm.  Psychiatric: She has a normal mood and affect.    ED Course  Procedures (including critical care time) Labs Review Labs Reviewed - No data to display  Imaging Review No results found.   EKG Interpretation   Date/Time:  Sunday September 24 2014 40:98:11 EDT Ventricular Rate:  77 PR Interval:  145 QRS Duration: 100 QT Interval:  388 QTC Calculation: 439 R Axis:   73 Text Interpretation:  Sinus rhythm Minimal ST depression, inferior leads  Baseline wander in lead(s) II III aVF No significant change since last  tracing Confirmed by [facility]  MD, CHRISTOPHER [phone]) on 09/24/2014  10:37:52 AM     Results for orders placed during the hospital encounter of 09/24/14  CBC WITH DIFFERENTIAL      Result Value Ref Range   WBC 5.2  4.0 - 10.5 K/uL   RBC 5.55 (*) 3.87 - 5.11 MIL/uL   Hemoglobin 14.2  12.0 - 15.0 g/dL   HCT 60.4  54.0 - 98.1 %   MCV 73.5 (*) 78.0 - 100.0 fL   MCH 25.6 (*) 26.0 - 34.0 pg   MCHC 34.8  30.0 - 36.0 g/dL   RDW 19.1  47.8 - 29.5 %   Platelets 254   150 - 400 K/uL   Neutrophils Relative % 63  43 - 77 %   Neutro Abs 3.3  1.7 - 7.7 K/uL   Lymphocytes Relative 26  12 - 46 %   Lymphs Abs 1.4  0.7 - 4.0 K/uL   Monocytes Relative 4  3 - 12 %   Monocytes Absolute 0.2  0.1 - 1.0 K/uL   Eosinophils Relative 7 (*) 0 - 5 %   Eosinophils Absolute 0.4  0.0 - 0.7 K/uL   Basophils Relative 0  0 - 1 %   Basophils Absolute 0.0  0.0 - 0.1 K/uL  BASIC METABOLIC PANEL      Result Value Ref Range   Sodium 136 (*) 137 - 147 mEq/L   Potassium 4.4  3.7 - 5.3 mEq/L   Chloride 99  96 - 112 mEq/L   CO2 26  19 - 32 mEq/L   Glucose, Bld 93  70 - 99 mg/dL   BUN 10  6 - 23 mg/dL   Creatinine, Ser 6.21  0.50 - 1.10 mg/dL   Calcium 9.4  8.4 - 30.8 mg/dL   GFR calc non Af Amer >90  >90 mL/min   GFR calc Af Amer >90  >90 mL/min   Anion gap 11  5 - 15  TROPONIN I      Result Value Ref Range   Troponin I <0.30  <0.30 ng/mL  PREGNANCY, URINE      Result Value Ref Range   Preg Test, Ur NEGATIVE  NEGATIVE  URINALYSIS, ROUTINE W REFLEX MICROSCOPIC      Result Value Ref Range   Color, Urine YELLOW  YELLOW   APPearance CLEAR  CLEAR   Specific Gravity, Urine 1.015  1.005 - 1.030   pH 6.0  5.0 - 8.0   Glucose, UA NEGATIVE  NEGATIVE mg/dL   Hgb urine dipstick NEGATIVE  NEGATIVE   Bilirubin Urine NEGATIVE  NEGATIVE   Ketones, ur NEGATIVE  NEGATIVE mg/dL   Protein, ur NEGATIVE  NEGATIVE mg/dL   Urobilinogen, UA 1.0  0.0 - 1.0 mg/dL   Nitrite NEGATIVE  NEGATIVE   Leukocytes, UA NEGATIVE  NEGATIVE   Dg Chest 2 View  09/24/2014   CLINICAL DATA:  Chest pain.  EXAM: CHEST  2 VIEW  COMPARISON:  MRI 04/24/2006.  FINDINGS: Mediastinum and hilar structures normal. Lungs are clear. Heart size normal. No pleural effusion or pneumothorax. Prominent epicardial fat pad noted on lateral view. No acute bony abnormality.  IMPRESSION: No active cardiopulmonary disease.   Electronically Signed   By: Maisie Fus  Register   On: 09/24/2014 10:31    MDM  Pt given hydrocodone with some  relief.   I suspect muscular pain.    Ekg  is normal, chest xray is normal.   Pt has a negative troponin.   Pt advised to follow up with primary MD.   Pt given Rx for ibuprofen and Hydrocodone    Final diagnoses:  Chest wall pain       Elson Areas, PA-C 09/24/14 1126

## 2014-09-24 NOTE — ED Provider Notes (Signed)
Medical screening examination/treatment/procedure(s) were performed by non-physician practitioner and as supervising physician I was immediately available for consultation/collaboration.   EKG Interpretation   Date/Time:  Sunday September 24 2014 82:95:62 EDT Ventricular Rate:  77 PR Interval:  145 QRS Duration: 100 QT Interval:  388 QTC Calculation: 439 R Axis:   73 Text Interpretation:  Sinus rhythm Minimal ST depression, inferior leads  Baseline wander in lead(s) II III aVF No significant change since last  tracing Confirmed by [facility]  MD, Telicia Hodgkiss [phone]) on 09/24/2014  10:37:52 AM        Gilda Crease, MD 09/24/14 [phone]

## 2014-09-24 NOTE — Discharge Instructions (Signed)

## 2014-09-24 NOTE — ED Notes (Signed)
Pt. Stated, i woke up and my back is hurting and my ribs which makes me hard to breath.  i have back pain all the time.

## 2014-11-14 ENCOUNTER — Emergency Department (HOSPITAL_COMMUNITY)
Admission: EM | Admit: 2014-11-14 | Discharge: 2014-11-14 | Disposition: A | Discharge status: Home / Self Care | Payer: Self-pay | Attending: Emergency Medicine | Admitting: Emergency Medicine

## 2014-11-14 ENCOUNTER — Emergency Department (HOSPITAL_COMMUNITY): Payer: Self-pay

## 2014-11-14 ENCOUNTER — Encounter (HOSPITAL_COMMUNITY): Payer: Self-pay | Admitting: Emergency Medicine

## 2014-11-14 DIAGNOSIS — M546 Pain in thoracic spine: Secondary | ICD-10-CM | POA: Insufficient documentation

## 2014-11-14 DIAGNOSIS — K8020 Calculus of gallbladder without cholecystitis without obstruction: Secondary | ICD-10-CM | POA: Insufficient documentation

## 2014-11-14 DIAGNOSIS — Z3202 Encounter for pregnancy test, result negative: Secondary | ICD-10-CM | POA: Insufficient documentation

## 2014-11-14 DIAGNOSIS — J45909 Unspecified asthma, uncomplicated: Secondary | ICD-10-CM | POA: Insufficient documentation

## 2014-11-14 DIAGNOSIS — R52 Pain, unspecified: Secondary | ICD-10-CM

## 2014-11-14 DIAGNOSIS — E119 Type 2 diabetes mellitus without complications: Secondary | ICD-10-CM | POA: Insufficient documentation

## 2014-11-14 HISTORY — DX: Type 2 diabetes mellitus without complications: E11.9

## 2014-11-14 LAB — URINALYSIS, ROUTINE W REFLEX MICROSCOPIC
Bilirubin Urine: NEGATIVE
GLUCOSE, UA: NEGATIVE mg/dL
Hgb urine dipstick: NEGATIVE
KETONES UR: NEGATIVE mg/dL
LEUKOCYTES UA: NEGATIVE
Nitrite: NEGATIVE
PH: 6.5 (ref 5.0–8.0)
Protein, ur: NEGATIVE mg/dL
Specific Gravity, Urine: 1.018 (ref 1.005–1.030)
Urobilinogen, UA: 1 mg/dL (ref 0.0–1.0)

## 2014-11-14 LAB — CBC WITH DIFFERENTIAL/PLATELET
BASOS ABS: 0 10*3/uL (ref 0.0–0.1)
Basophils Relative: 1 % (ref 0–1)
Eosinophils Absolute: 0.3 10*3/uL (ref 0.0–0.7)
Eosinophils Relative: 6 % — ABNORMAL HIGH (ref 0–5)
HCT: 39.5 % (ref 36.0–46.0)
Hemoglobin: 13.3 g/dL (ref 12.0–15.0)
Lymphocytes Relative: 25 % (ref 12–46)
Lymphs Abs: 1.4 10*3/uL (ref 0.7–4.0)
MCH: 25.3 pg — ABNORMAL LOW (ref 26.0–34.0)
MCHC: 33.7 g/dL (ref 30.0–36.0)
MCV: 75.1 fL — ABNORMAL LOW (ref 78.0–100.0)
Monocytes Absolute: 0.2 10*3/uL (ref 0.1–1.0)
Monocytes Relative: 4 % (ref 3–12)
Neutro Abs: 3.8 10*3/uL (ref 1.7–7.7)
Neutrophils Relative %: 64 % (ref 43–77)
PLATELETS: 283 10*3/uL (ref 150–400)
RBC: 5.26 MIL/uL — AB (ref 3.87–5.11)
RDW: 13.3 % (ref 11.5–15.5)
WBC: 5.8 10*3/uL (ref 4.0–10.5)

## 2014-11-14 LAB — COMPREHENSIVE METABOLIC PANEL
ALK PHOS: 59 U/L (ref 39–117)
ALT: 24 U/L (ref 0–35)
AST: 24 U/L (ref 0–37)
Albumin: 3.7 g/dL (ref 3.5–5.2)
Anion gap: 12 (ref 5–15)
BUN: 8 mg/dL (ref 6–23)
CO2: 26 meq/L (ref 19–32)
Calcium: 9.1 mg/dL (ref 8.4–10.5)
Chloride: 99 mEq/L (ref 96–112)
Creatinine, Ser: 0.6 mg/dL (ref 0.50–1.10)
GFR calc Af Amer: >90 mL/min (ref >90)
GFR calc non Af Amer: >90 mL/min (ref >90)
Glucose, Bld: 95 mg/dL (ref 70–99)
POTASSIUM: 4 meq/L (ref 3.7–5.3)
SODIUM: 137 meq/L (ref 137–147)
Total Bilirubin: 0.4 mg/dL (ref 0.3–1.2)
Total Protein: 7.4 g/dL (ref 6.0–8.3)

## 2014-11-14 LAB — LIPASE, BLOOD: Lipase: 20 U/L (ref 11–59)

## 2014-11-14 LAB — PREGNANCY, URINE: Preg Test, Ur: NEGATIVE

## 2014-11-14 MED ORDER — HYDROMORPHONE HCL 1 MG/ML IJ SOLN
1.0000 mg | Freq: Once | INTRAMUSCULAR | Status: DC
  Filled 2014-11-14: qty 1

## 2014-11-14 MED ORDER — ONDANSETRON 4 MG PO TBDP
4.0000 mg | ORAL_TABLET | Freq: Once | ORAL | Status: AC
  Administered 2014-11-14: 4 mg via ORAL
  Filled 2014-11-14: qty 1

## 2014-11-14 MED ORDER — HYDROMORPHONE HCL 1 MG/ML IJ SOLN
1.0000 mg | Freq: Once | INTRAMUSCULAR | Status: AC
  Administered 2014-11-14: 1 mg via INTRAMUSCULAR

## 2014-11-14 MED ORDER — HYDROCODONE-ACETAMINOPHEN 5-325 MG PO TABS: 1.0000 | ORAL_TABLET | Freq: Four times a day (QID) | ORAL | Status: DC | PRN

## 2014-11-14 MED ORDER — ONDANSETRON HCL 4 MG/2ML IJ SOLN
4.0000 mg | Freq: Once | INTRAMUSCULAR | Status: DC
  Filled 2014-11-14: qty 2

## 2014-11-14 MED ORDER — SODIUM CHLORIDE 0.9 % IV BOLUS (SEPSIS): 1000.0000 mL | Freq: Once | INTRAVENOUS | Status: DC

## 2014-11-14 NOTE — ED Provider Notes (Signed)
CSN: [id number]     Arrival date & time 11/14/14  [id number] History   First MD Initiated Contact with Patient 11/14/14 [phone]     Chief Complaint  Patient presents with  . Back Pain     (Consider location/radiation/quality/duration/timing/severity/associated sxs/prior Treatment) Patient is a 23 y.o. female presenting with back pain. The history is provided by the patient (the pt complains of back pain and abd pain).  Back Pain Location:  Lumbar spine and thoracic spine Quality:  Aching Radiates to:  Does not radiate Pain severity:  Moderate Pain is:  Unable to specify Onset quality:  Sudden Timing:  Intermittent Progression:  Waxing and waning Chronicity:  New Associated symptoms: abdominal pain   Associated symptoms: no chest pain and no headaches     Past Medical History  Diagnosis Date  . Asthma   . Diabetes mellitus without complication    Past Surgical History  Procedure Laterality Date  . Back surgery    . Tonsillectomy    . Adenoidectomy     Family History  Problem Relation Age of Onset  . Diabetes Mother   . Hypertension Mother   . Cancer Mother    History  Substance Use Topics  . Smoking status: Never Smoker   . Smokeless tobacco: Never Used  . Alcohol Use: Yes     Comment: ocassionally - vodka   OB History    No data available     Review of Systems  Constitutional: Negative for appetite change and fatigue.  HENT: Negative for congestion, ear discharge and sinus pressure.   Eyes: Negative for discharge.  Respiratory: Negative for cough.   Cardiovascular: Negative for chest pain.  Gastrointestinal: Positive for abdominal pain. Negative for diarrhea.  Genitourinary: Negative for frequency and hematuria.  Musculoskeletal: Positive for back pain.  Skin: Negative for rash.  Neurological: Negative for seizures and headaches.  Psychiatric/Behavioral: Negative for hallucinations.      Allergies  Review of patient's allergies indicates no known  allergies.  Home Medications   Prior to Admission medications   Medication Sig Start Date End Date Taking? Authorizing Provider  ibuprofen (ADVIL,MOTRIN) 200 MG tablet Take 400 mg by mouth every 6 (six) hours as needed (pain).   Yes Historical Provider, MD  HYDROcodone-acetaminophen (NORCO/VICODIN) 5-325 MG per tablet Take 1 tablet by mouth every 6 (six) hours as needed for moderate pain. 11/14/14   Benny LennertJoseph L Latacha Texeira, MD  ibuprofen (ADVIL,MOTRIN) 800 MG tablet Take 1 tablet (800 mg total) by mouth 3 (three) times daily. Patient not taking: Reported on 11/14/2014 09/24/14   Elson AreasLeslie K Sofia, PA-C   BP 116/59 mmHg  Pulse 82  Temp(Src) 97.6 F (36.4 C) (Oral)  Resp 19  SpO2 99%  LMP 11/07/2014 Physical Exam  Constitutional: She is oriented to person, place, and time. She appears well-developed.  HENT:  Head: Normocephalic.  Eyes: Conjunctivae and EOM are normal. No scleral icterus.  Neck: Neck supple. No thyromegaly present.  Cardiovascular: Normal rate and regular rhythm.  Exam reveals no gallop and no friction rub.   No murmur heard. Pulmonary/Chest: No stridor. She has no wheezes. She has no rales. She exhibits no tenderness.  Abdominal: She exhibits no distension. There is tenderness. There is no rebound.  Mild ruq tender  Musculoskeletal: Normal range of motion. She exhibits tenderness. She exhibits no edema.  Tender mild lower thoracic and upper lumbar spine  Lymphadenopathy:    She has no cervical adenopathy.  Neurological: She is oriented to person, place, and  time. She exhibits normal muscle tone. Coordination normal.  Skin: No rash noted. No erythema.  Psychiatric: She has a normal mood and affect. Her behavior is normal.    ED Course  Procedures (including critical care time) Labs Review Labs Reviewed  CBC WITH DIFFERENTIAL - Abnormal; Notable for the following:    RBC 5.26 (*)    MCV 75.1 (*)    MCH 25.3 (*)    Eosinophils Relative 6 (*)    All other components  within normal limits  COMPREHENSIVE METABOLIC PANEL  LIPASE, BLOOD  URINALYSIS, ROUTINE W REFLEX MICROSCOPIC  PREGNANCY, URINE    Imaging Review [place] Abdomen Complete  11/14/2014   CLINICAL DATA:  23 year old female with acute generalized abdominal pain. Initial encounter. Current history of morbid obesity.  EXAM: ULTRASOUND ABDOMEN COMPLETE  COMPARISON:  Abdominal radiographs 05/04/2009.  FINDINGS: Gallbladder: Multiple shadowing gallstones individually up to 17 mm diameter. Gallbladder wall thickness remains normal at up to 2 mm. No sonographic Murphy sign elicited. No pericholecystic fluid.  Common bile duct: Diameter: 3 mm, normal  Liver: No focal lesion identified. Within normal limits in parenchymal echogenicity. No intrahepatic biliary ductal dilatation.  IVC: No abnormality visualized.  Pancreas: Incompletely visualized due to overlying bowel gas, visualized portions within normal limits.  Spleen: Size and appearance within normal limits.  Right Kidney: Length: 11.6 cm. Echogenicity within normal limits. No mass or hydronephrosis visualized.  Left Kidney: Length: 11.4 cm. Echogenicity within normal limits. No mass or hydronephrosis visualized.  Abdominal aorta: No aneurysm visualized.  Other findings: None.  IMPRESSION: Cholelithiasis without evidence of acute cholecystitis.   Electronically Signed   By: Augusto GambleLee  Hall M.D.   On: 11/14/2014 08:49     EKG Interpretation   Date/Time:  Tuesday November 14 2014 05:35:10 EST Ventricular Rate:  88 PR Interval:  165 QRS Duration: 97 QT Interval:  364 QTC Calculation: 440 R Axis:   84 Text Interpretation:  Sinus rhythm Baseline wander in lead(s) II aVF When  compared with ECG of 09/24/2014, No significant change was found Confirmed  by [facility]  [facility], DAVID ([id number]) on 11/14/2014 5:46:55 AM      MDM   Final diagnoses:  Pain  Gall stones    Pt with gall stones,  Will be referred to surgeon   .    Benny LennertJoseph L Honor Frison, MD 11/14/14 1101

## 2014-11-14 NOTE — Discharge Instructions (Signed)
Follow up with [facility] in 2-3 weeks.   Stay away from fatty and grease foods

## 2014-11-14 NOTE — ED Notes (Signed)
Pt. woke up this morning with mid and upper back pain / mild SOB , denies injury or fall , no cough or congestion , pt. stated heavy lifting ( boxes ) at work .

## 2014-11-14 NOTE — ED Notes (Signed)
Patient transported to Ultrasound 

## 2014-11-16 NOTE — Progress Notes (Signed)
[facility] was not able to see the patient. [facility] orange card information will be sent to the address provided. °

## 2015-01-01 ENCOUNTER — Encounter (HOSPITAL_COMMUNITY): Payer: Self-pay | Admitting: *Deleted

## 2015-01-01 ENCOUNTER — Emergency Department (HOSPITAL_COMMUNITY)
Admission: EM | Admit: 2015-01-01 | Discharge: 2015-01-01 | Disposition: A | Discharge status: Home / Self Care | Payer: No Typology Code available for payment source | Attending: Emergency Medicine | Admitting: Emergency Medicine

## 2015-01-01 ENCOUNTER — Emergency Department (HOSPITAL_COMMUNITY): Payer: No Typology Code available for payment source

## 2015-01-01 DIAGNOSIS — E119 Type 2 diabetes mellitus without complications: Secondary | ICD-10-CM | POA: Insufficient documentation

## 2015-01-01 DIAGNOSIS — Y9389 Activity, other specified: Secondary | ICD-10-CM | POA: Insufficient documentation

## 2015-01-01 DIAGNOSIS — Z791 Long term (current) use of non-steroidal anti-inflammatories (NSAID): Secondary | ICD-10-CM | POA: Insufficient documentation

## 2015-01-01 DIAGNOSIS — Y998 Other external cause status: Secondary | ICD-10-CM | POA: Diagnosis not present

## 2015-01-01 DIAGNOSIS — S40012A Contusion of left shoulder, initial encounter: Secondary | ICD-10-CM

## 2015-01-01 DIAGNOSIS — S0083XA Contusion of other part of head, initial encounter: Secondary | ICD-10-CM | POA: Insufficient documentation

## 2015-01-01 DIAGNOSIS — S0093XA Contusion of unspecified part of head, initial encounter: Secondary | ICD-10-CM

## 2015-01-01 DIAGNOSIS — Y92410 Unspecified street and highway as the place of occurrence of the external cause: Secondary | ICD-10-CM | POA: Insufficient documentation

## 2015-01-01 DIAGNOSIS — S0990XA Unspecified injury of head, initial encounter: Secondary | ICD-10-CM | POA: Diagnosis present

## 2015-01-01 DIAGNOSIS — J45909 Unspecified asthma, uncomplicated: Secondary | ICD-10-CM | POA: Insufficient documentation

## 2015-01-01 MED ORDER — OXYCODONE-ACETAMINOPHEN 5-325 MG PO TABS: 1.0000 | ORAL_TABLET | Freq: Four times a day (QID) | ORAL | Status: DC | PRN

## 2015-01-01 MED ORDER — CYCLOBENZAPRINE HCL 10 MG PO TABS
5.0000 mg | ORAL_TABLET | Freq: Once | ORAL | Status: AC
  Administered 2015-01-01: 5 mg via ORAL
  Filled 2015-01-01: qty 1

## 2015-01-01 MED ORDER — OXYCODONE-ACETAMINOPHEN 5-325 MG PO TABS
1.0000 | ORAL_TABLET | Freq: Once | ORAL | Status: AC
  Administered 2015-01-01: 1 via ORAL
  Filled 2015-01-01: qty 1

## 2015-01-01 MED ORDER — CYCLOBENZAPRINE HCL 5 MG PO TABS: 5.0000 mg | ORAL_TABLET | Freq: Three times a day (TID) | ORAL | Status: AC

## 2015-01-01 NOTE — Discharge Instructions (Signed)
Contusion °A contusion is a deep bruise. Contusions are the result of an injury that caused bleeding under the skin. The contusion may turn blue, purple, or yellow. Minor injuries will give you a painless contusion, but more severe contusions may stay painful and swollen for a few weeks.  °CAUSES  °A contusion is usually caused by a blow, trauma, or direct force to an area of the body. °SYMPTOMS  °· Swelling and redness of the injured area. °· Bruising of the injured area. °· Tenderness and soreness of the injured area. °· Pain. °DIAGNOSIS  °The diagnosis can be made by taking a history and physical exam. An X-ray, CT scan, or MRI may be needed to determine if there were any associated injuries, such as fractures. °TREATMENT  °Specific treatment will depend on what area of the body was injured. In general, the best treatment for a contusion is resting, icing, elevating, and applying cold compresses to the injured area. Over-the-counter medicines may also be recommended for pain control. Ask your caregiver what the best treatment is for your contusion. °HOME CARE INSTRUCTIONS  °· Put ice on the injured area. °¨ Put ice in a plastic bag. °¨ Place a towel between your skin and the bag. °¨ Leave the ice on for 15-20 minutes, 3-4 times a day, or as directed by your health care provider. °· Only take over-the-counter or prescription medicines for pain, discomfort, or fever as directed by your caregiver. Your caregiver may recommend avoiding anti-inflammatory medicines (aspirin, ibuprofen, and naproxen) for 48 hours because these medicines may increase bruising. °· Rest the injured area. °· If possible, elevate the injured area to reduce swelling. °SEEK IMMEDIATE MEDICAL CARE IF:  °· You have increased bruising or swelling. °· You have pain that is getting worse. °· Your swelling or pain is not relieved with medicines. °MAKE SURE YOU:  °· Understand these instructions. °· Will watch your condition. °· Will get help right  away if you are not doing well or get worse. °Document Released: 09/24/2005 Document Revised: 12/20/2013 Document Reviewed: 10/20/2011 °Exit[employer]® Patient Information ©2015 [employer]. This information is not intended to replace advice given to you by your health care provider. Make sure you discuss any questions you have with your health care provider. ° °
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
## 2016-03-17 ENCOUNTER — Emergency Department (HOSPITAL_COMMUNITY)
Admission: EM | Admit: 2016-03-17 | Discharge: 2016-03-17 | Disposition: A | Discharge status: Home / Self Care | Payer: No Typology Code available for payment source | Attending: Emergency Medicine | Admitting: Emergency Medicine

## 2016-03-17 ENCOUNTER — Encounter (HOSPITAL_COMMUNITY): Payer: Self-pay | Admitting: *Deleted

## 2016-03-17 ENCOUNTER — Encounter (HOSPITAL_COMMUNITY): Payer: Self-pay | Admitting: Emergency Medicine

## 2016-03-17 DIAGNOSIS — R112 Nausea with vomiting, unspecified: Secondary | ICD-10-CM | POA: Insufficient documentation

## 2016-03-17 DIAGNOSIS — E119 Type 2 diabetes mellitus without complications: Secondary | ICD-10-CM | POA: Insufficient documentation

## 2016-03-17 DIAGNOSIS — J45909 Unspecified asthma, uncomplicated: Secondary | ICD-10-CM | POA: Insufficient documentation

## 2016-03-17 DIAGNOSIS — Z3202 Encounter for pregnancy test, result negative: Secondary | ICD-10-CM | POA: Insufficient documentation

## 2016-03-17 DIAGNOSIS — R109 Unspecified abdominal pain: Secondary | ICD-10-CM | POA: Insufficient documentation

## 2016-03-17 DIAGNOSIS — R1084 Generalized abdominal pain: Secondary | ICD-10-CM | POA: Insufficient documentation

## 2016-03-17 LAB — POC URINE PREG, ED: Preg Test, Ur: NEGATIVE

## 2016-03-17 MED ORDER — ONDANSETRON HCL 4 MG PO TABS: 4.0000 mg | ORAL_TABLET | Freq: Four times a day (QID) | ORAL | Status: DC

## 2016-03-17 NOTE — ED Notes (Signed)
Pt c/o abd pain, emesis since this morning. Emesis x 2 today.

## 2016-03-17 NOTE — ED Notes (Signed)
Per Mist[facility] StanleyLisa NS, [facility]  Pt to be dismissed.

## 2016-03-17 NOTE — ED Notes (Signed)
Pt refusing blood draw until she gets to the room.

## 2016-03-17 NOTE — ED Provider Notes (Signed)
CSN: [id number]     Arrival date & time 03/17/16  1623 History  By signing my name below, I, Gonzella Lex, attest that this documentation has been prepared under the direction and in the presence of Kerrie Buffalo, NP. Electronically Signed: Gonzella Lex, Scribe. 03/17/2016. 5:56 PM.   Chief Complaint  Patient presents with  . Abdominal Pain   Patient is a 25 y.o. female presenting with abdominal pain. The history is provided by the patient. No language interpreter was used.  Abdominal Pain Pain location:  Generalized Pain quality: sharp   Pain radiates to:  Does not radiate Pain severity:  Mild Onset quality:  Sudden Duration:  10 hours Timing:  Constant Progression:  Unchanged Chronicity:  New Context comment:  Post-emesis Relieved by:  Nothing Worsened by:  Vomiting Ineffective treatments:  None tried Associated symptoms: nausea and vomiting   Associated symptoms: no diarrhea, no vaginal bleeding and no vaginal discharge   Nausea:    Severity:  Mild   Onset quality:  Sudden   Duration:  10 hours   Progression:  Resolved Vomiting:    Number of occurrences:  2   Severity:  Mild   Duration:  10 hours   Timing:  Constant   Progression:  Resolved  HPI Comments: Sheila Alvarez is a 25 y.o. female, with a PMH of back surgery, asthma, and DM, who presents to the Emergency Department complaining of sudden onset of gradually improving nausea and vomiting which began thirteen hours ago. Pt first threw up 13 hours ago followed by her second episode of emesis 10 hours ago, and has not thrown up since then. She also notes associated, sharp abdominal pain, but states that her nausea has resolved. Pt notes that she ate nachos yesterday before she woke up and threw up, and suspects that there may have been some bad hamburger meat in the nachos. Since she threw up, she has been able to eat dry cereal and has been drinking water. Pt denies diarrhea, vaginal discharge, and vaginal  bleeding. She does not take any medication regularly.   Past Medical History  Diagnosis Date  . Asthma   . Diabetes mellitus without complication [facility])    Past Surgical History  Procedure Laterality Date  . Back surgery    . Tonsillectomy    . Adenoidectomy     Family History  Problem Relation Age of Onset  . Diabetes Mother   . Hypertension Mother   . Cancer Mother    Social History  Substance Use Topics  . Smoking status: Never Smoker   . Smokeless tobacco: Never Used  . Alcohol Use: Yes     Comment: ocassionally - vodka   OB History    No data available     Review of Systems  Gastrointestinal: Positive for nausea, vomiting and abdominal pain. Negative for diarrhea.  Genitourinary: Negative for vaginal bleeding and vaginal discharge.  All other systems reviewed and are negative.   Allergies  Review of patient's allergies indicates no known allergies.  Home Medications   Prior to Admission medications   Medication Sig Start Date End Date Taking? Authorizing Provider  HYDROcodone-acetaminophen (NORCO/VICODIN) 5-325 MG per tablet Take 1 tablet by mouth every 6 (six) hours as needed for moderate pain. 11/14/14   Bethann Berkshire, MD  ibuprofen (ADVIL,MOTRIN) 200 MG tablet Take 400 mg by mouth every 6 (six) hours as needed (pain).    Historical Provider, MD  ibuprofen (ADVIL,MOTRIN) 800 MG tablet Take 1 tablet (  800 mg total) by mouth 3 (three) times daily. Patient not taking: Reported on 11/14/2014 09/24/14   Elson AreasLeslie K Sofia, PA-C  ondansetron (ZOFRAN) 4 MG tablet Take 1 tablet (4 mg total) by mouth every 6 (six) hours. 03/17/16   Hope Orlene OchM Neese, NP  oxyCODONE-acetaminophen (PERCOCET/ROXICET) 5-325 MG per tablet Take 1 tablet by mouth every 6 (six) hours as needed for severe pain. 01/01/15   Earley FavorGail Schulz, NP   BP 108/57 mmHg  Pulse 81  Temp(Src) 97.6 F (36.4 C) (Oral)  Resp 16  Ht 5\' 6"  (1.676 m)  Wt 124.739 kg  BMI 44.41 kg/m2  SpO2 100%  LMP 03/03/2016 Physical Exam   Constitutional: She is oriented to person, place, and time. She appears well-developed and well-nourished. No distress.  HENT:  Head: Normocephalic and atraumatic.  Eyes: Conjunctivae are normal.  Neck:  No CVA tenderness Uvula is midline, no edema or erythema   Cardiovascular: Normal rate, regular rhythm and normal heart sounds.   RRR  Pulmonary/Chest: Effort normal and breath sounds normal.  Lungs are clear bilaterally  Abdominal: Soft. Bowel sounds are normal. She exhibits no distension. There is no tenderness.  Neurological: She is alert and oriented to person, place, and time.  Skin: Skin is warm and dry.  Psychiatric: She has a normal mood and affect.  Nursing note and vitals reviewed.   ED Course  Procedures  DIAGNOSTIC STUDIES:    Oxygen Saturation is 97% on RA, adequate by my interpretation.   COORDINATION OF CARE:  5:46 PM Will review labs and will administer nausea medication.  Discussed treatment plan with pt at bedside and pt agreed to plan.   Labs Review Labs Reviewed  POC URINE PREG, ED   MDM  25 y.o. female with nausea and vomiting that resolved after Zofran stable for d/c and able to keep down fluids. Will give Rx for nausea. Stable for d/c without dehydration. Discussed with the patient and all questioned fully answered. She will return if any problems arise.   Final diagnoses:  Nausea and vomiting, vomiting of unspecified type   I personally performed the services described in this documentation, which was scribed in my presence. The recorded information has been reviewed and is accurate.    [address]Hope [place] City[place] Neese, NP 03/18/16 [id number]  Lavera Guiseana Duo Liu, MD 03/19/16 [phone]

## 2016-03-17 NOTE — ED Notes (Signed)
Pt c/o nausea, vomiting, and abdominal pain since 4 am this morning. Pt denies diarrhea. Denies fever. Pt has not taken any OTC medications. A&Ox4 and ambulatory.

## 2016-03-17 NOTE — Discharge Instructions (Signed)

## 2016-03-17 NOTE — ED Notes (Signed)
Pt refusing blood work at this time, states she will wait until she gets in a room.

## 2016-05-21 ENCOUNTER — Emergency Department (HOSPITAL_COMMUNITY): Payer: No Typology Code available for payment source

## 2016-05-21 ENCOUNTER — Encounter (HOSPITAL_COMMUNITY): Payer: Self-pay

## 2016-05-21 ENCOUNTER — Emergency Department (HOSPITAL_COMMUNITY)
Admission: EM | Admit: 2016-05-21 | Discharge: 2016-05-21 | Disposition: A | Discharge status: Home / Self Care | Payer: No Typology Code available for payment source | Attending: Emergency Medicine | Admitting: Emergency Medicine

## 2016-05-21 DIAGNOSIS — Z791 Long term (current) use of non-steroidal anti-inflammatories (NSAID): Secondary | ICD-10-CM | POA: Insufficient documentation

## 2016-05-21 DIAGNOSIS — Z711 Person with feared health complaint in whom no diagnosis is made: Secondary | ICD-10-CM

## 2016-05-21 DIAGNOSIS — Z79899 Other long term (current) drug therapy: Secondary | ICD-10-CM | POA: Insufficient documentation

## 2016-05-21 DIAGNOSIS — M545 Low back pain: Secondary | ICD-10-CM | POA: Insufficient documentation

## 2016-05-21 DIAGNOSIS — J45909 Unspecified asthma, uncomplicated: Secondary | ICD-10-CM | POA: Insufficient documentation

## 2016-05-21 DIAGNOSIS — E119 Type 2 diabetes mellitus without complications: Secondary | ICD-10-CM | POA: Insufficient documentation

## 2016-05-21 DIAGNOSIS — N898 Other specified noninflammatory disorders of vagina: Secondary | ICD-10-CM | POA: Insufficient documentation

## 2016-05-21 LAB — URINALYSIS, ROUTINE W REFLEX MICROSCOPIC
BILIRUBIN URINE: NEGATIVE
GLUCOSE, UA: NEGATIVE mg/dL
HGB URINE DIPSTICK: NEGATIVE
KETONES UR: NEGATIVE mg/dL
Leukocytes, UA: NEGATIVE
Nitrite: POSITIVE — AB
PH: 6 (ref 5.0–8.0)
Protein, ur: NEGATIVE mg/dL
SPECIFIC GRAVITY, URINE: 1.013 (ref 1.005–1.030)

## 2016-05-21 LAB — URINE MICROSCOPIC-ADD ON

## 2016-05-21 LAB — WET PREP, GENITAL
Clue Cells Wet Prep HPF POC: NONE SEEN
SPERM: NONE SEEN
Trich, Wet Prep: NONE SEEN
YEAST WET PREP: NONE SEEN

## 2016-05-21 LAB — CBG MONITORING, ED: Glucose-Capillary: 73 mg/dL (ref 65–99)

## 2016-05-21 LAB — POC URINE PREG, ED: Preg Test, Ur: NEGATIVE

## 2016-05-21 MED ORDER — LIDOCAINE HCL 1 % IJ SOLN
INTRAMUSCULAR | Status: AC
  Administered 2016-05-21: 1.2 mL
  Filled 2016-05-21: qty 20

## 2016-05-21 MED ORDER — NAPROXEN 250 MG PO TABS: 250.0000 mg | ORAL_TABLET | Freq: Two times a day (BID) | ORAL | Status: DC

## 2016-05-21 MED ORDER — CEFTRIAXONE SODIUM 250 MG IJ SOLR
250.0000 mg | Freq: Once | INTRAMUSCULAR | Status: AC
  Administered 2016-05-21: 250 mg via INTRAMUSCULAR
  Filled 2016-05-21: qty 250

## 2016-05-21 MED ORDER — ACETAMINOPHEN 325 MG PO TABS
650.0000 mg | ORAL_TABLET | Freq: Once | ORAL | Status: AC
  Administered 2016-05-21: 650 mg via ORAL
  Filled 2016-05-21: qty 2

## 2016-05-21 MED ORDER — AZITHROMYCIN 250 MG PO TABS
1000.0000 mg | ORAL_TABLET | Freq: Once | ORAL | Status: AC
  Administered 2016-05-21: 1000 mg via ORAL
  Filled 2016-05-21: qty 4

## 2016-05-21 NOTE — Discharge Instructions (Signed)
Back Exercises °The following exercises strengthen the muscles that help to support the back. They also help to keep the lower back flexible. Doing these exercises can help to prevent back pain or lessen existing pain. °If you have back pain or discomfort, try doing these exercises 2-3 times each day or as told by your health care provider. When the pain goes away, do them once each day, but increase the number of times that you repeat the steps for each exercise (do more repetitions). If you do not have back pain or discomfort, do these exercises once each day or as told by your health care provider. °EXERCISES °Single Knee to Chest °Repeat these steps 3-5 times for each leg: °· Lie on your back on a firm bed or the floor with your legs extended. °· Bring one knee to your chest. Your other leg should stay extended and in contact with the floor. °· Hold your knee in place by grabbing your knee or thigh. °· Pull on your knee until you feel a gentle stretch in your lower back. °· Hold the stretch for 10-30 seconds. °· Slowly release and straighten your leg. °Pelvic Tilt °Repeat these steps 5-10 times: °· Lie on your back on a firm bed or the floor with your legs extended. °· Bend your knees so they are pointing toward the ceiling and your feet are flat on the floor. °· Tighten your lower abdominal muscles to press your lower back against the floor. This motion will tilt your pelvis so your tailbone points up toward the ceiling instead of pointing to your feet or the floor. °· With gentle tension and even breathing, hold this position for 5-10 seconds. °Cat-Cow °Repeat these steps until your lower back becomes more flexible: °· Get into a hands-and-knees position on a firm surface. Keep your hands under your shoulders, and keep your knees under your hips. You may place padding under your knees for comfort. °· Let your head hang down, and point your tailbone toward the floor so your lower back becomes rounded like the  back of a cat. °· Hold this position for 5 seconds. °· Slowly lift your head and point your tailbone up toward the ceiling so your back forms a sagging arch like the back of a cow. °· Hold this position for 5 seconds. °Press-Ups °Repeat these steps 5-10 times: °· Lie on your abdomen (face-down) on the floor. °· Place your palms near your head, about shoulder-width apart. °· While you keep your back as relaxed as possible and keep your hips on the floor, slowly straighten your arms to raise the top half of your body and lift your shoulders. Do not use your back muscles to raise your upper torso. You may adjust the placement of your hands to make yourself more comfortable. °· Hold this position for 5 seconds while you keep your back relaxed. °· Slowly return to lying flat on the floor. °Bridges °Repeat these steps 10 times: °· Lie on your back on a firm surface. °· Bend your knees so they are pointing toward the ceiling and your feet are flat on the floor. °· Tighten your buttocks muscles and lift your buttocks off of the floor until your waist is at almost the same height as your knees. You should feel the muscles working in your buttocks and the back of your thighs. If you do not feel these muscles, slide your feet 1-2 inches farther away from your buttocks. °· Hold this position for 3-5   seconds. °· Slowly lower your hips to the starting position, and allow your buttocks muscles to relax completely. °If this exercise is too easy, try doing it with your arms crossed over your chest. °Abdominal Crunches °Repeat these steps 5-10 times: °· Lie on your back on a firm bed or the floor with your legs extended. °· Bend your knees so they are pointing toward the ceiling and your feet are flat on the floor. °· Cross your arms over your chest. °· Tip your chin slightly toward your chest without bending your neck. °· Tighten your abdominal muscles and slowly raise your trunk (torso) high enough to lift your shoulder blades a  tiny bit off of the floor. Avoid raising your torso higher than that, because it can put too much stress on your low back and it does not help to strengthen your abdominal muscles. °· Slowly return to your starting position. °Back Lifts °Repeat these steps 5-10 times: °· Lie on your abdomen (face-down) with your arms at your sides, and rest your forehead on the floor. °· Tighten the muscles in your legs and your buttocks. °· Slowly lift your chest off of the floor while you keep your hips pressed to the floor. Keep the back of your head in line with the curve in your back. Your eyes should be looking at the floor. °· Hold this position for 3-5 seconds. °· Slowly return to your starting position. °SEEK MEDICAL CARE IF: °· Your back pain or discomfort gets much worse when you do an exercise. °· Your back pain or discomfort does not lessen within 2 hours after you exercise. °If you have any of these problems, stop doing these exercises right away. Do not do them again unless your health care provider says that you can. °SEEK IMMEDIATE MEDICAL CARE IF: °· You develop sudden, severe back pain. If this happens, stop doing the exercises right away. Do not do them again unless your health care provider says that you can. °  °This information is not intended to replace advice given to you by your health care provider. Make sure you discuss any questions you have with your health care provider. °  °Document Released: 01/22/2005 Document Revised: 09/05/2015 Document Reviewed: 02/08/2015 °Elsevier Interactive Patient Education ©2016 [employer] °Back Pain, Adult °Back pain is very common in adults. The cause of back pain is rarely dangerous and the pain often gets better over time. The cause of your back pain may not be known. Some common causes of back pain include: °· Strain of the muscles or ligaments supporting the spine. °· Wear and tear (degeneration) of the spinal disks. °· Arthritis. °· Direct injury to the back. °For  many people, back pain may return. Since back pain is rarely dangerous, most people can learn to manage this condition on their own. °HOME CARE INSTRUCTIONS °Watch your back pain for any changes. The following actions may help to lessen any discomfort you are feeling: °· Remain active. It is stressful on your back to sit or stand in one place for long periods of time. Do not sit, drive, or stand in one place for more than 30 minutes at a time. Take short walks on even surfaces as soon as you are able. Try to increase the length of time you walk each day. °· Exercise regularly as directed by your health care provider. Exercise helps your back heal faster. It also helps avoid future injury by keeping your muscles strong and flexible. °· Do not stay in bed. Resting   more than 1-2 days can delay your recovery. °· Pay attention to your body when you bend and lift. The most comfortable positions are those that put less stress on your recovering back. Always use proper lifting techniques, including: °· Bending your knees. °· Keeping the load close to your body. °· Avoiding twisting. °· Find a comfortable position to sleep. Use a firm mattress and lie on your side with your knees slightly bent. If you lie on your back, put a pillow under your knees. °· Avoid feeling anxious or stressed. Stress increases muscle tension and can worsen back pain. It is important to recognize when you are anxious or stressed and learn ways to manage it, such as with exercise. °· Take medicines only as directed by your health care provider. Over-the-counter medicines to reduce pain and inflammation are often the most helpful. Your health care provider may prescribe muscle relaxant drugs. These medicines help dull your pain so you can more quickly return to your normal activities and healthy exercise. °· Apply ice to the injured area: °· Put ice in a plastic bag. °· Place a towel between your skin and the bag. °· Leave the ice on for 20 minutes,  2-3 times a day for the first 2-3 days. After that, ice and heat may be alternated to reduce pain and spasms. °· Maintain a healthy weight. Excess weight puts extra stress on your back and makes it difficult to maintain good posture. °SEEK MEDICAL CARE IF: °· You have pain that is not relieved with rest or medicine. °· You have increasing pain going down into the legs or buttocks. °· You have pain that does not improve in one week. °· You have night pain. °· You lose weight. °· You have a fever or chills. °SEEK IMMEDIATE MEDICAL CARE IF:  °· You develop new bowel or bladder control problems. °· You have unusual weakness or numbness in your arms or legs. °· You develop nausea or vomiting. °· You develop abdominal pain. °· You feel faint. °  °This information is not intended to replace advice given to you by your health care provider. Make sure you discuss any questions you have with your health care provider. °  °Document Released: 12/15/2005 Document Revised: 01/05/2015 Document Reviewed: 04/18/2014 °Elsevier Interactive Patient Education ©2016 [employer] °Sexually Transmitted Disease °A sexually transmitted disease (STD) is a disease or infection that may be passed (transmitted) from person to person, usually during sexual activity. This may happen by way of saliva, semen, blood, vaginal mucus, or urine. Common STDs include: °· Gonorrhea. °· Chlamydia. °· Syphilis. °· HIV and AIDS. °· Genital herpes. °· Hepatitis B and C. °· Trichomonas. °· Human papillomavirus (HPV). °· Pubic lice. °· Scabies. °· Mites. °· Bacterial vaginosis. °WHAT ARE CAUSES OF STDs? °An STD may be caused by bacteria, a virus, or parasites. STDs are often transmitted during sexual activity if one person is infected. However, they may also be transmitted through nonsexual means. STDs may be transmitted after:  °· Sexual intercourse with an infected person. °· Sharing sex toys with an infected person. °· Sharing needles with an infected person  or using unclean piercing or tattoo needles. °· Having intimate contact with the genitals, mouth, or rectal areas of an infected person. °· Exposure to infected fluids during birth. °WHAT ARE THE SIGNS AND SYMPTOMS OF STDs? °Different STDs have different symptoms. Some people may not have any symptoms. If symptoms are present, they may include: °· Painful or bloody urination. °· Pain in the pelvis,   abdomen, vagina, anus, throat, or eyes. °· A skin rash, itching, or irritation. °· Growths, ulcerations, blisters, or sores in the genital and anal areas. °· Abnormal vaginal discharge with or without bad odor. °· Penile discharge in men. °· Fever. °· Pain or bleeding during sexual intercourse. °· Swollen glands in the groin area. °· Yellow skin and eyes (jaundice). This is seen with hepatitis. °· Swollen testicles. °· Infertility. °· Sores and blisters in the mouth. °HOW ARE STDs DIAGNOSED? °To make a diagnosis, your health care provider may: °· Take a medical history. °· Perform a physical exam. °· Take a sample of any discharge to examine. °· Swab the throat, cervix, opening to the penis, rectum, or vagina for testing. °· Test a sample of your first morning urine. °· Perform blood tests. °· Perform a Pap test, if this applies. °· Perform a colposcopy. °· Perform a laparoscopy. °HOW ARE STDs TREATED? °Treatment depends on the STD. Some STDs may be treated but not cured. °· Chlamydia, gonorrhea, trichomonas, and syphilis can be cured with antibiotic medicine. °· Genital herpes, hepatitis, and HIV can be treated, but not cured, with prescribed medicines. The medicines lessen symptoms. °· Genital warts from HPV can be treated with medicine or by freezing, burning (electrocautery), or surgery. Warts may come back. °· HPV cannot be cured with medicine or surgery. However, abnormal areas may be removed from the cervix, vagina, or vulva. °· If your diagnosis is confirmed, your recent sexual partners need treatment. This is  true even if they are symptom-free or have a negative culture or evaluation. They should not have sex until their health care providers say it is okay. °· Your health care provider may test you for infection again 3 months after treatment. °HOW CAN I REDUCE MY RISK OF GETTING AN STD? °Take these steps to reduce your risk of getting an STD: °· Use latex condoms, dental dams, and water-soluble lubricants during sexual activity. Do not use petroleum jelly or oils. °· Avoid having multiple sex partners. °· Do not have sex with someone who has other sex partners °· Do not have sex with anyone you do not know or who is at high risk for an STD. °· Avoid risky sex practices that can break your skin. °· Do not have sex if you have open sores on your mouth or skin. °· Avoid drinking too much alcohol or taking illegal drugs. Alcohol and drugs can affect your judgment and put you in a vulnerable position. °· Avoid engaging in oral and anal sex acts. °· Get vaccinated for HPV and hepatitis. If you have not received these vaccines in the past, talk to your health care provider about whether one or both might be right for you. °· If you are at risk of being infected with HIV, it is recommended that you take a prescription medicine daily to prevent HIV infection. This is called pre-exposure prophylaxis (PrEP). You are considered at risk if: °¨ You are a man who has sex with other men (MSM). °¨ You are a heterosexual man or woman and are sexually active with more than one partner. °¨ You take drugs by injection. °¨ You are sexually active with a partner who has HIV. °· Talk with your health care provider about whether you are at high risk of being infected with HIV. If you choose to begin PrEP, you should first be tested for HIV. You should then be tested every 3 months for as long as you are taking PrEP. °  WHAT SHOULD I DO IF I THINK I HAVE AN STD? °· See your health care provider. °· Tell your sexual partner(s). They should be  tested and treated for any STDs. °· Do not have sex until your health care provider says it is okay. °WHEN SHOULD I GET IMMEDIATE MEDICAL CARE? °Contact your health care provider right away if:  °· You have severe abdominal pain. °· You are a man and notice swelling or pain in your testicles. °· You are a woman and notice swelling or pain in your vagina. °  °This information is not intended to replace advice given to you by your health care provider. Make sure you discuss any questions you have with your health care provider. °  °Document Released: 03/07/2003 Document Revised: 01/05/2015 Document Reviewed: 07/05/2013 °Elsevier Interactive Patient Education ©2016 [employer] ° °

## 2016-05-21 NOTE — ED Provider Notes (Signed)
CSN: [id number]     Arrival date & time 05/21/16  1446 History  By signing my name below, I, Ronney Lion, attest that this documentation has been prepared under the direction and in the presence of Will Tyquan Carmickle, PA-C. Electronically Signed: Ronney Lion, ED Scribe. 05/21/2016. 4:43 PM.    Chief Complaint  Patient presents with  . Back Pain   The history is provided by the patient. No language interpreter was used.    HPI Comments: Sheila Alvarez is a 25 y.o. female with a history of back surgery, DM, and asthma, who presents to the Emergency Department complaining of atraumatic, gradual-onset, constant, 8/10 coccyx and low back pain that began 1 week ago. She denies any falls or injuries. Patient states her pain is worse with movement and sitting. She states she has not tried any treatments or medications for her symptoms. She denies dysuria, fever, abdominal pain, nausea, vomiting, diarrhea, numbness, tingling, weakness, vaginal bleeding. No loss of bowel or bladder control. No history of IV drug use. No history of cancer.  She also complains of constant, moderate vaginal discharge that began 2 weeks ago. She states she believes she might have a yeast infection. She states her LMP occurred 2 weeks ago, and her discharge onset shortly afterwards. She has been sexually active many months ago.   Patient also complains of urinary frequency and urgency that she states has been ongoing for 1 year. She attributes this to a history of DM. No changes to her urination recently. No dysuria or hematuria. She reports she does not currently take any medications for her DM and does not regularly monitory her blood sugars at home. She states she is pre-diabetic.      Past Medical History  Diagnosis Date  . Asthma   . Diabetes mellitus without complication [facility])    Past Surgical History  Procedure Laterality Date  . Back surgery    . Tonsillectomy    . Adenoidectomy     Family History  Problem Relation  Age of Onset  . Diabetes Mother   . Hypertension Mother   . Cancer Mother    Social History  Substance Use Topics  . Smoking status: Never Smoker   . Smokeless tobacco: Never Used  . Alcohol Use: Yes     Comment: ocassionally - vodka   OB History    No data available     Review of Systems  Constitutional: Negative for fever.  HENT: Negative for congestion, mouth sores and sore throat.   Eyes: Negative for visual disturbance.  Respiratory: Negative for cough and shortness of breath.   Cardiovascular: Negative for chest pain, palpitations and leg swelling.  Gastrointestinal: Negative for nausea, vomiting, abdominal pain and diarrhea.  Genitourinary: Positive for urgency, frequency and vaginal discharge. Negative for dysuria, hematuria, decreased urine volume, vaginal bleeding, difficulty urinating, genital sores, vaginal pain, menstrual problem and pelvic pain.  Musculoskeletal: Positive for back pain. Negative for gait problem and neck pain.  Skin: Negative for rash.  Neurological: Negative for weakness, numbness and headaches.    Allergies  Review of patient's allergies indicates no known allergies.  Home Medications   Prior to Admission medications   Medication Sig Start Date End Date Taking? Authorizing Provider  HYDROcodone-acetaminophen (NORCO/VICODIN) 5-325 MG per tablet Take 1 tablet by mouth every 6 (six) hours as needed for moderate pain. 11/14/14   Bethann Berkshire, MD  ibuprofen (ADVIL,MOTRIN) 200 MG tablet Take 400 mg by mouth every 6 (six) hours as needed (  pain).    Historical Provider, MD  ibuprofen (ADVIL,MOTRIN) 800 MG tablet Take 1 tablet (800 mg total) by mouth 3 (three) times daily. Patient not taking: Reported on 11/14/2014 09/24/14   Elson AreasLeslie K Sofia, PA-C  naproxen (NAPROSYN) 250 MG tablet Take 1 tablet (250 mg total) by mouth 2 (two) times daily with a meal. 05/21/16   Everlene FarrierWilliam Kirby Cortese, PA-C  ondansetron (ZOFRAN) 4 MG tablet Take 1 tablet (4 mg total) by mouth  every 6 (six) hours. 03/17/16   Hope Orlene OchM Neese, NP  oxyCODONE-acetaminophen (PERCOCET/ROXICET) 5-325 MG per tablet Take 1 tablet by mouth every 6 (six) hours as needed for severe pain. 01/01/15   Earley FavorGail Schulz, NP   BP 119/65 mmHg  Pulse 78  Temp(Src) 98.7 F (37.1 C) (Oral)  Resp 16  SpO2 99%  LMP 05/07/2016 (Approximate) Physical Exam  Constitutional: She appears well-developed and well-nourished. No distress.  Nontoxic appearing obese female.  HENT:  Head: Normocephalic and atraumatic.  Eyes: Conjunctivae are normal. Pupils are equal, round, and reactive to light. Right eye exhibits no discharge. Left eye exhibits no discharge.  Neck: Neck supple.  Cardiovascular: Normal rate, regular rhythm, normal heart sounds and intact distal pulses.   Pulmonary/Chest: Effort normal and breath sounds normal. No respiratory distress. She has no wheezes. She has no rales.  Abdominal: Soft. Bowel sounds are normal. She exhibits no distension. There is no tenderness. There is no guarding.  Abdomen is soft and nontender to palpation. No CVA or flank tenderness.   Genitourinary: Vaginal discharge found.  Pelvic exam preformed by me with female nurse chaperone. No external lesions or rashes noted. Patient has moderate amount of vaginal discharge noted. No cervical motion tenderness. No adnexal tenderness or fullness. Patient does have some mild suprapubic tenderness to palpation on bimanual exam. No CMT. Cervix is closed. No vaginal bleeding.  Musculoskeletal: She exhibits tenderness. She exhibits no edema.  Midline lumbar and sacral tenderness to palpation. There is overlying old, well-healed surgical incision scar to her lumbar spine. No back erythema, deformity, ecchymosis or warmth. No evidence of abscess or cellulitis. No lower extremity edema or tenderness. Good strength to her bilateral lower extremities.  Lymphadenopathy:    She has no cervical adenopathy.  Neurological: She is alert. She has normal  reflexes. She displays normal reflexes. Coordination normal.  Bilateral patellar DTR's intact. Sensation intact her bilateral lower extremities. Normal gait.  Skin: Skin is warm and dry. No rash noted. She is not diaphoretic. No erythema. No pallor.  Psychiatric: She has a normal mood and affect. Her behavior is normal.  Nursing note and vitals reviewed.   ED Course  Procedures (including critical care time)  DIAGNOSTIC STUDIES: Oxygen Saturation is 96% on RA, normal by my interpretation.    COORDINATION OF CARE: 4:18 PM - Discussed treatment plan with pt at bedside which includes UA, pregnancy test. Pt verbalized understanding and agreed to plan.   Labs Review Labs Reviewed  WET PREP, GENITAL - Abnormal; Notable for the following:    WBC, Wet Prep HPF POC MANY (*)    All other components within normal limits  URINALYSIS, ROUTINE W REFLEX MICROSCOPIC (NOT AT [facility]) - Abnormal; Notable for the following:    APPearance CLOUDY (*)    Nitrite POSITIVE (*)    All other components within normal limits  URINE MICROSCOPIC-ADD ON - Abnormal; Notable for the following:    Squamous Epithelial / LPF 0-5 (*)    Bacteria, UA MANY (*)    All  other components within normal limits  URINE CULTURE  CBG MONITORING, ED  POC URINE PREG, ED  GC/CHLAMYDIA PROBE AMP ([place]) NOT AT [facility]    Imaging Review Dg Lumbar Spine Complete  05/21/2016  CLINICAL DATA:  Middle low back pain without injury 2 weeks. EXAM: LUMBAR SPINE - COMPLETE 4+ VIEW COMPARISON:  05/04/2009 FINDINGS: Vertebral body alignment, heights and disc space heights are within normal. There has been a previous L4 and L5 laminectomy. No evidence of compression fracture or subluxation IMPRESSION: No acute findings. Previous L4-5 laminectomy. Electronically Signed   By: Elberta Fortis M.D.   On: 05/21/2016 17:41   Dg Sacrum/coccyx  05/21/2016  CLINICAL DATA:  Mid low back pain 2 weeks.  No injury. EXAM: SACRUM AND COCCYX - 2+ VIEW  COMPARISON:  05/04/2009 FINDINGS: There is no evidence of fracture or other focal bone lesions. IMPRESSION: Negative. Electronically Signed   By: Elberta Fortis M.D.   On: 05/21/2016 17:39   I have personally reviewed and evaluated these images and lab results as part of my medical decision-making.   EKG Interpretation None     Filed Vitals:   05/21/16 1504 05/21/16 1819  BP: 124/57 119/65  Pulse: 85 78  Temp: 98.7 F (37.1 C)   TempSrc: Oral   Resp: 18 16  SpO2: 96% 99%    MDM   Meds given in ED:  Medications  acetaminophen (TYLENOL) tablet 650 mg (650 mg Oral Given 05/21/16 1816)  cefTRIAXone (ROCEPHIN) injection 250 mg (250 mg Intramuscular Given 05/21/16 1847)  azithromycin (ZITHROMAX) tablet 1,000 mg (1,000 mg Oral Given 05/21/16 1846)  lidocaine (XYLOCAINE) 1 % (with pres) injection (1.2 mLs  Given 05/21/16 1847)    New Prescriptions   NAPROXEN (NAPROSYN) 250 MG TABLET    Take 1 tablet (250 mg total) by mouth 2 (two) times daily with a meal.    Final diagnoses:  Bilateral low back pain, with sciatica presence unspecified  Vaginal discharge  Concern about STD in female without diagnosis   This is a 25 y.o. female With history of prediabetes and previous lumbar laminectomy presents the emergency department complaining of pain to her lumbar spine and sacrum with no known injury. She also reports that she's been having vaginal discharge for the past 2 weeks. She tells me she is having urinary frequency ongoing for over a year. On exam the patient is afebrile and nontoxic appearing. She has some tenderness to her lumbar spine and sacrum. No evidence of abscess or cellulitis. No back erythema or deformity. On pelvic exam the patient has a mild amount of vaginal discharge with no cervical motion tenderness. No adnexal tenderness or fullness. She does have some mild suprapubic tenderness to palpation on bimanual exam. Recent changes to her urination. She is a negative pregnancy test.  CBG was 73. Wet prep returned with many white blood cells and no other abnormalities. Urinalysis showed no glucosuria. It was nitrite positive but negative for leukocytes. As patient reports no recent changes to her urination and she is having vaginal discharge and believe this may be a pelvic origin. I'm not concerned for PID at this time as she has no cervical motion tenderness. Will treat for gonorrhea and Chlamydia as these tests are pending currently. Patient received Rocephin and azithromycin in the emergency department. I encouraged her to follow-up on her pending test results. Her urine was also sent for culture due to the nitrite positive urine. I have low suspicion for UTI at this time  but will treat with antibiotics if urine culture is positive. I discussed safe sex education. X-rays were unremarkable. Will discharge a prescription for naproxen for her back pain. I discussed strict and specific return precautions. I advised the patient to follow-up with their primary care provider this week. I advised the patient to return to the emergency department with new or worsening symptoms or new concerns. The patient verbalized understanding and agreement with plan.    I personally performed the services described in this documentation, which was scribed in my presence. The recorded information has been reviewed and is accurate.         Everlene Farrier, PA-C 05/21/16 1855  Leta Baptist, MD 05/25/16 2030

## 2016-05-21 NOTE — ED Notes (Signed)
Pt presents with c/o back pain and pain in her tailbone. Pt reports the pain started approx one week ago, no injury reported by pt. Pt is ambulatory in waiting room.

## 2016-05-21 NOTE — ED Notes (Signed)
Patient transported to X-ray 

## 2016-05-21 NOTE — ED Notes (Signed)
PT DISCHARGED. INSTRUCTIONS AND PRESCRIPTION GIVEN. AAOX4. PT IN NO APPARENT DISTRESS. THE OPPORTUNITY TO ASK QUESTIONS WAS PROVIDED. 

## 2016-05-22 LAB — GC/CHLAMYDIA PROBE AMP (~~LOC~~) NOT AT ARMC
CHLAMYDIA, DNA PROBE: NEGATIVE
NEISSERIA GONORRHEA: NEGATIVE

## 2016-05-24 LAB — URINE CULTURE: Culture: >=100000 — AB

## 2016-05-25 ENCOUNTER — Telehealth (HOSPITAL_BASED_OUTPATIENT_CLINIC_OR_DEPARTMENT_OTHER): Payer: Self-pay

## 2016-05-25 NOTE — Telephone Encounter (Signed)
Post ED Visit - Positive Culture Follow-up  Culture report reviewed by antimicrobial stewardship pharmacist:  []  Enzo BiNathan Batchelder, Pharm.D. []  Celedonio MiyamotoJeremy Frens, Pharm.D., BCPS [x]  Garvin FilaMike Maccia, Pharm.D. []  Georgina PillionElizabeth Martin, Pharm.D., BCPS []  GarwinMinh Pham, [address]Ph[address]D., BCPS, AAHIVP []  Estella HuskMichelle Turner, Pharm.D., BCPS, AAHIVP []  Tennis Mustassie Stewart, Pharm.D. []  Sherle Poeob Vincent, [address]Pharm[address]  Positive urine culture with E. Coli and no further patient follow-up is required at this time.  Jerry CarasCullom, Andrius Andrepont Burnett 05/25/2016, 9:02 AM

## 2016-08-15 ENCOUNTER — Emergency Department (HOSPITAL_COMMUNITY): Payer: Self-pay

## 2016-08-15 ENCOUNTER — Encounter (HOSPITAL_COMMUNITY): Payer: Self-pay

## 2016-08-15 ENCOUNTER — Emergency Department (HOSPITAL_COMMUNITY)
Admission: EM | Admit: 2016-08-15 | Discharge: 2016-08-15 | Disposition: A | Discharge status: Home / Self Care | Payer: Self-pay | Attending: Emergency Medicine | Admitting: Emergency Medicine

## 2016-08-15 DIAGNOSIS — R42 Dizziness and giddiness: Secondary | ICD-10-CM | POA: Insufficient documentation

## 2016-08-15 DIAGNOSIS — H539 Unspecified visual disturbance: Secondary | ICD-10-CM | POA: Insufficient documentation

## 2016-08-15 DIAGNOSIS — J45909 Unspecified asthma, uncomplicated: Secondary | ICD-10-CM | POA: Insufficient documentation

## 2016-08-15 DIAGNOSIS — R6883 Chills (without fever): Secondary | ICD-10-CM | POA: Insufficient documentation

## 2016-08-15 DIAGNOSIS — R0602 Shortness of breath: Secondary | ICD-10-CM | POA: Insufficient documentation

## 2016-08-15 DIAGNOSIS — R05 Cough: Secondary | ICD-10-CM | POA: Insufficient documentation

## 2016-08-15 DIAGNOSIS — E119 Type 2 diabetes mellitus without complications: Secondary | ICD-10-CM | POA: Insufficient documentation

## 2016-08-15 DIAGNOSIS — Z791 Long term (current) use of non-steroidal anti-inflammatories (NSAID): Secondary | ICD-10-CM | POA: Insufficient documentation

## 2016-08-15 DIAGNOSIS — R002 Palpitations: Secondary | ICD-10-CM | POA: Insufficient documentation

## 2016-08-15 LAB — URINALYSIS, ROUTINE W REFLEX MICROSCOPIC
Bilirubin Urine: NEGATIVE
GLUCOSE, UA: NEGATIVE mg/dL
Hgb urine dipstick: NEGATIVE
Ketones, ur: NEGATIVE mg/dL
LEUKOCYTES UA: NEGATIVE
NITRITE: NEGATIVE
PH: 6 (ref 5.0–8.0)
Protein, ur: NEGATIVE mg/dL
SPECIFIC GRAVITY, URINE: 1.024 (ref 1.005–1.030)

## 2016-08-15 LAB — CBC
HEMATOCRIT: 41 % (ref 36.0–46.0)
HEMOGLOBIN: 14.2 g/dL (ref 12.0–15.0)
MCH: 24.9 pg — ABNORMAL LOW (ref 26.0–34.0)
MCHC: 34.6 g/dL (ref 30.0–36.0)
MCV: 71.8 fL — AB (ref 78.0–100.0)
Platelets: 311 10*3/uL (ref 150–400)
RBC: 5.71 MIL/uL — ABNORMAL HIGH (ref 3.87–5.11)
RDW: 15.1 % (ref 11.5–15.5)
WBC: 7 10*3/uL (ref 4.0–10.5)

## 2016-08-15 LAB — BASIC METABOLIC PANEL
ANION GAP: 5 (ref 5–15)
BUN: 15 mg/dL (ref 6–20)
CHLORIDE: 110 mmol/L (ref 101–111)
CO2: 27 mmol/L (ref 22–32)
Calcium: 9.3 mg/dL (ref 8.9–10.3)
Creatinine, Ser: 0.77 mg/dL (ref 0.44–1.00)
GFR calc Af Amer: >60 mL/min (ref >60)
GLUCOSE: 82 mg/dL (ref 65–99)
POTASSIUM: 3.6 mmol/L (ref 3.5–5.1)
Sodium: 142 mmol/L (ref 135–145)

## 2016-08-15 LAB — D-DIMER, QUANTITATIVE (NOT AT ARMC)

## 2016-08-15 LAB — CBG MONITORING, ED: Glucose-Capillary: 127 mg/dL — ABNORMAL HIGH (ref 65–99)

## 2016-08-15 MED ORDER — SODIUM CHLORIDE 0.9 % IV BOLUS (SEPSIS)
1000.0000 mL | Freq: Once | INTRAVENOUS | Status: AC
  Administered 2016-08-15: 1000 mL via INTRAVENOUS

## 2016-08-15 NOTE — Discharge Instructions (Signed)
Read the information below.   Your labs are re-assuring. You EKG and chest x-ray were re-assuring. This may have been related to possible dehydration. It is important that you drink plenty of fluids.  I encourage you to follow up with a primary care provider early next week for re-evaluation. There is a number in your discharge paperwork that you can call to establish a primary care doctor. You can also call your insurance company to determine which local providers are accepting new patients.   You may return to the Emergency Department at any time for worsening condition or any new symptoms that concern you. Return to ED if you develop any new or worsening symptoms or you have chest pain, loss of consciousness, numbness/weakness, unilateral leg swelling/pain, or fever.

## 2016-08-15 NOTE — ED Provider Notes (Signed)
[facility]-EMERGENCY DEPT Provider Note   CSN: [id number] Arrival date & time: 08/15/16  1754     History   Chief Complaint Chief Complaint  Patient presents with  . Dizziness    HPI Sheila Alvarez is a 25 y.o. female.  Sheila Alvarez is a 25 y.o. female with history of asthma presents to ED with complaint of lightheadedness. Patient reports she was working in [employer] today when she had a sudden wave of feeling "extremely hot," short of breath, colored spots in vision, nausea, and felt lightheaded. She sat down. She did not lose consciousness. She has associated palpitations and non-productive cough. She denies chest pain, fever, myalgias, arthralgias, neck pain, headache, numbness, weakness, lower leg swelling/pain, fever, wheezing, or rashes. She denies any recent long distance travel/surgery/hospitalization, hormone therapy, hemoptysis, h/o cancer, h/o blood clots. No history of cardiac conditions. No family history of cardiac hx.       Past Medical History:  Diagnosis Date  . Asthma   . Diabetes mellitus without complication (HCC)     There are no active problems to display for this patient.   Past Surgical History:  Procedure Laterality Date  . ADENOIDECTOMY    . BACK SURGERY    . TONSILLECTOMY      OB History    No data available       Home Medications    Prior to Admission medications   Medication Sig Start Date End Date Taking? Authorizing Provider  ibuprofen (ADVIL,MOTRIN) 200 MG tablet Take 400 mg by mouth every 6 (six) hours as needed (pain).   Yes Historical Provider, MD  naproxen (NAPROSYN) 250 MG tablet Take 1 tablet (250 mg total) by mouth 2 (two) times daily with a meal. Patient not taking: Reported on 08/15/2016 05/21/16   Everlene FarrierWilliam Dansie, PA-C  ondansetron (ZOFRAN) 4 MG tablet Take 1 tablet (4 mg total) by mouth every 6 (six) hours. Patient not taking: Reported on 08/15/2016 03/17/16   Janne NapoleonHope M Neese, NP    Family History Family History    Problem Relation Age of Onset  . Diabetes Mother   . Hypertension Mother   . Cancer Mother     Social History Social History  Substance Use Topics  . Smoking status: Never Smoker  . Smokeless tobacco: Never Used  . Alcohol use Yes     Comment: ocassionally - vodka     Allergies   Review of patient's allergies indicates no known allergies.   Review of Systems Review of Systems  Constitutional: Positive for chills. Negative for diaphoresis and fever.  HENT: Negative for congestion, postnasal drip and trouble swallowing.   Eyes: Positive for visual disturbance ( "saw colors" resolved).  Respiratory: Positive for cough ( non-productive) and shortness of breath. Negative for wheezing.   Cardiovascular: Positive for palpitations. Negative for chest pain and leg swelling.  Gastrointestinal: Negative for abdominal pain, blood in stool, constipation, diarrhea, nausea and vomiting.  Genitourinary: Negative for dysuria, hematuria, vaginal bleeding and vaginal pain.  Musculoskeletal: Negative for arthralgias and myalgias.  Skin: Negative for rash.  Neurological: Positive for light-headedness. Negative for syncope, weakness, numbness and headaches.     Physical Exam Updated Vital Signs BP (!) 136/105 (BP Location: Right Arm)   Pulse 104   Temp 97.9 F (36.6 C) (Oral)   Resp 20   Ht 5\' 5"  (1.651 m)   Wt 124.3 kg   LMP 08/04/2016   SpO2 99%   BMI 45.60 kg/m   Physical Exam  Constitutional: She appears well-developed and well-nourished. No distress.  HENT:  Head: Normocephalic and atraumatic.  Mouth/Throat: Oropharynx is clear and moist. No oropharyngeal exudate.  Eyes: Conjunctivae and EOM are normal. Pupils are equal, round, and reactive to light. Right eye exhibits no discharge. Left eye exhibits no discharge. No scleral icterus.  Neck: Normal range of motion. Neck supple.  Cardiovascular: Regular rhythm, normal heart sounds and intact distal pulses.  Tachycardia present.    No murmur heard. Pulmonary/Chest: Effort normal and breath sounds normal. No stridor. No respiratory distress. She has no wheezes.  Abdominal: Soft. Bowel sounds are normal. She exhibits no distension. There is no tenderness. There is no rebound and no guarding.  Musculoskeletal: Normal range of motion. She exhibits no edema or tenderness.  No lower extremity swelling. No TTP of posterior calf. No palpable cords.   Lymphadenopathy:    She has no cervical adenopathy.  Neurological: She is alert. She is not disoriented. Coordination normal. GCS eye subscore is 4. GCS verbal subscore is 5. GCS motor subscore is 6.  Mental Status:  Alert, thought content appropriate, able to give a coherent history. Speech fluent without evidence of aphasia. Able to follow 2 step commands without difficulty.  Cranial Nerves:  II:  Peripheral visual fields grossly normal, pupils equal, round, reactive to light III,IV, VI: ptosis not present, extra-ocular motions intact bilaterally  V,VII: smile symmetric, facial light touch sensation equal VIII: hearing grossly normal to voice  X: uvula elevates symmetrically  XI: bilateral shoulder shrug symmetric and strong XII: midline tongue extension without fassiculations Motor:  Normal tone. 5/5 in upper and lower extremities bilaterally including strong and equal grip strength and dorsiflexion/plantar flexion Sensory: light touch normal in all extremities. Cerebellar: normal finger-to-nose with bilateral upper extremities Gait: normal gait and balance CV: distal pulses palpable throughout   Skin: Skin is warm and dry. She is not diaphoretic.  Psychiatric: She has a normal mood and affect. Her behavior is normal.     ED Treatments / Results  Labs (all labs ordered are listed, but only abnormal results are displayed) Labs Reviewed  CBC - Abnormal; Notable for the following:       Result Value   RBC 5.71 (*)    MCV 71.8 (*)    MCH 24.9 (*)    All other  components within normal limits  CBG MONITORING, ED - Abnormal; Notable for the following:    Glucose-Capillary 127 (*)    All other components within normal limits  BASIC METABOLIC PANEL  URINALYSIS, ROUTINE W REFLEX MICROSCOPIC (NOT AT [facility])  D-DIMER, QUANTITATIVE (NOT AT [facility])    EKG  EKG Interpretation  Date/Time:  Friday August 15 2016 17:57:59 EDT Ventricular Rate:  103 PR Interval:    QRS Duration: 90 QT Interval:  342 QTC Calculation: 448 R Axis:   75 Text Interpretation:  Sinus tachycardia Borderline repolarization abnormality Baseline wander in lead(s) V1 V6 no significant change since 2015 Confirmed by GOLDSTON MD, SCOTT [phone]) on 08/15/2016 8:47:23 PM       Radiology Dg Chest 2 View  Result Date: 08/15/2016 CLINICAL DATA:  Acute onset of shortness of breath, lightheadedness and dizziness. Initial encounter. EXAM: CHEST  2 VIEW COMPARISON:  Chest radiograph performed 09/24/2014 FINDINGS: The lungs are well-aerated and clear. There is no evidence of focal opacification, pleural effusion or pneumothorax. The heart is borderline normal in size. No acute osseous abnormalities are seen. IMPRESSION: No acute cardiopulmonary process seen. Electronically Signed   By: Leotis Shames  Chang M.D.   On: 08/15/2016 20:07    Procedures Procedures (including critical care time)  Medications Ordered in ED Medications  sodium chloride 0.9 % bolus 1,000 mL (0 mLs Intravenous Stopped 08/15/16 2209)     Initial Impression / Assessment and Plan / ED Course  I have reviewed the triage vital signs and the nursing notes.  Pertinent labs & imaging results that were available during my care of the patient were reviewed by me and considered in my medical decision making (see chart for details).  Clinical Course  Value Comment By Time  DG Chest 2 View Normal cardiac silhouette. No evidence of focal consolidation, effusion, or PTX.  Lona Kettleshley Laurel Meyer, [place] JerseyPA-C 08/18 2030    Patient is afebrile  and non-toxic appearing in NAD. Vital signs remarkable for tachycardia, otherwise stable. Physical exam is re-assuring.  No focal neurologic deficits. CBC and BMP re-assuring. Given SOB will obtain CXR. Given prodrome of sxs, check orthostatics. IVF. Well's 1.5, d-dimer negative - low suspicion for PE.   Orthostatics negative, although this is after IVF. CXR negative for acute cardiopulmonary process. EKG unchanged from previous tracing.  Low suspicion for cardiac etiology. Patient not on chronic medications - doubt medications as source. ?dehydration - patient works in [employer] and states she did not drink much today vs. ?vaso-vagal for sxs.   On re-evaluation, patient is asx. Discussed results with patient. Encouraged patient to drink plenty of fluids and remain hydrated. Encouraged follow up with PCP and provided information regarding establishing a PCP. Return precautions discussed. Patient voiced understanding and is agreeable.   Final Clinical Impressions(s) / ED Diagnoses   Final diagnoses:  Lightheadedness    New Prescriptions Discharge Medication List as of 08/15/2016 10:23 PM       Lona KettleAshley Laurel Meyer, PA-C 08/16/16 0820    Pricilla LovelessScott Goldston, MD 08/16/16 [phone]

## 2016-08-15 NOTE — ED Notes (Signed)
Pt ambulated well in the hall and to the bathroom without assistance.  Pt denies dizziness during ambulation

## 2016-08-15 NOTE — ED Notes (Signed)
PA at bedside.

## 2016-08-15 NOTE — ED Triage Notes (Signed)
Pt states she felt fine.  Came to work. Started feeling light headed and dizzy.  Has had no illness symptoms.  No previous hx or cardiac symptoms.  Pt denies chest pain.

## 2016-09-18 ENCOUNTER — Encounter (HOSPITAL_COMMUNITY): Payer: Self-pay | Admitting: Emergency Medicine

## 2016-09-18 ENCOUNTER — Emergency Department (HOSPITAL_COMMUNITY)
Admission: EM | Admit: 2016-09-18 | Discharge: 2016-09-18 | Disposition: A | Discharge status: Home / Self Care | Payer: Self-pay | Attending: Dermatology | Admitting: Dermatology

## 2016-09-18 DIAGNOSIS — E119 Type 2 diabetes mellitus without complications: Secondary | ICD-10-CM | POA: Insufficient documentation

## 2016-09-18 DIAGNOSIS — J45909 Unspecified asthma, uncomplicated: Secondary | ICD-10-CM | POA: Insufficient documentation

## 2016-09-18 DIAGNOSIS — Z79899 Other long term (current) drug therapy: Secondary | ICD-10-CM | POA: Insufficient documentation

## 2016-09-18 DIAGNOSIS — N939 Abnormal uterine and vaginal bleeding, unspecified: Secondary | ICD-10-CM | POA: Insufficient documentation

## 2016-09-18 DIAGNOSIS — Z791 Long term (current) use of non-steroidal anti-inflammatories (NSAID): Secondary | ICD-10-CM | POA: Insufficient documentation

## 2016-09-18 DIAGNOSIS — Z5321 Procedure and treatment not carried out due to patient leaving prior to being seen by health care provider: Secondary | ICD-10-CM | POA: Insufficient documentation

## 2016-09-18 NOTE — ED Triage Notes (Signed)
Per pt, states vaginal bleeding and cramping-states this is not normal for her

## 2016-09-18 NOTE — ED Triage Notes (Signed)
Pt left after triage due to wait time 

## 2017-03-23 ENCOUNTER — Encounter (HOSPITAL_COMMUNITY): Payer: Self-pay | Admitting: Emergency Medicine

## 2017-03-23 ENCOUNTER — Emergency Department (HOSPITAL_COMMUNITY)
Admission: EM | Admit: 2017-03-23 | Discharge: 2017-03-23 | Disposition: A | Discharge status: Home / Self Care | Payer: Self-pay | Attending: Emergency Medicine | Admitting: Emergency Medicine

## 2017-03-23 DIAGNOSIS — L659 Nonscarring hair loss, unspecified: Secondary | ICD-10-CM | POA: Insufficient documentation

## 2017-03-23 DIAGNOSIS — E119 Type 2 diabetes mellitus without complications: Secondary | ICD-10-CM | POA: Insufficient documentation

## 2017-03-23 DIAGNOSIS — J45909 Unspecified asthma, uncomplicated: Secondary | ICD-10-CM | POA: Insufficient documentation

## 2017-03-23 NOTE — ED Triage Notes (Signed)
Pt complaint of hair loss over past month; denies other symptoms.

## 2017-03-23 NOTE — ED Provider Notes (Signed)
[facility]-EMERGENCY DEPT Provider Note   CSN: [id number] Arrival date & time: 03/23/17  1305  By signing my name below, I, Sheila Alvarez, attest that this documentation has been prepared under the direction and in the presence of non-physician practitioner, Sheila BayleyAlex Atticus Wedin, PA-C. Electronically Signed: Modena JanskyAlbert Alvarez, Scribe. 03/23/2017. 2:39 PM.  History   Chief Complaint Chief Complaint  Patient presents with  . Alopecia   The history is provided by the patient. No language interpreter was used.   HPI Comments: Sheila Alvarez is a 26 y.o. female who presents to the Emergency Department complaining of alopecia that started about a month ago. She noticed patches of hair coming out with combing, and hair on her pillow after sleeping. She tried shaving her hair without relief. She reports associated head itchiness and headache sometimes in the morning. She denies any medication changes, hormone therapy, braided hairstyle, thyroid problems, abdominal pain, or other complaints.   Past Medical History:  Diagnosis Date  . Asthma   . Diabetes mellitus without complication (HCC)     There are no active problems to display for this patient.   Past Surgical History:  Procedure Laterality Date  . ADENOIDECTOMY    . BACK SURGERY    . TONSILLECTOMY      OB History    No data available       Home Medications    Prior to Admission medications   Medication Sig Start Date End Date Taking? Authorizing Provider  ibuprofen (ADVIL,MOTRIN) 200 MG tablet Take 400 mg by mouth every 6 (six) hours as needed (pain).    Historical Provider, MD  naproxen (NAPROSYN) 250 MG tablet Take 1 tablet (250 mg total) by mouth 2 (two) times daily with a meal. Patient not taking: Reported on 08/15/2016 05/21/16   Sheila FarrierWilliam Dansie, PA-C  ondansetron (ZOFRAN) 4 MG tablet Take 1 tablet (4 mg total) by mouth every 6 (six) hours. Patient not taking: Reported on 08/15/2016 03/17/16   Sheila NapoleonHope M Neese, NP    Family History Family  History  Problem Relation Age of Onset  . Diabetes Mother   . Hypertension Mother   . Cancer Mother     Social History Social History  Substance Use Topics  . Smoking status: Never Smoker  . Smokeless tobacco: Never Used  . Alcohol use Yes     Comment: ocassionally - vodka     Allergies   Patient has no known allergies.   Review of Systems Review of Systems  HENT:       +hair loss  Gastrointestinal: Negative for abdominal pain.  Neurological: Positive for headaches.     Physical Exam Updated Vital Signs BP (!) 175/93 (BP Location: Left Arm)   Pulse 86   Temp 97.9 F (36.6 C) (Oral)   Resp 18   LMP 03/16/2017   SpO2 100%   Physical Exam  Constitutional: She appears well-developed and well-nourished. No distress.  HENT:  Head: Normocephalic and atraumatic.  Mouth/Throat: Oropharynx is clear and moist. No oropharyngeal exudate.  Areas of hair loss around occiput and frontal hairline with crown remaining; hair appears to be broken off, no underlying rashes noted  Eyes: Conjunctivae are normal. Pupils are equal, round, and reactive to light. Right eye exhibits no discharge. Left eye exhibits no discharge. No scleral icterus.  Neck: Normal range of motion. Neck supple. No thyromegaly present.  Cardiovascular: Normal rate, regular rhythm, normal heart sounds and intact distal pulses.  Exam reveals no gallop and no friction rub.  No murmur heard. Pulmonary/Chest: Effort normal and breath sounds normal. No stridor. No respiratory distress. She has no wheezes. She has no rales.  Abdominal: Soft. Bowel sounds are normal. She exhibits no distension. There is no tenderness. There is no rebound and no guarding.  Musculoskeletal: She exhibits no edema.  Lymphadenopathy:    She has no cervical adenopathy.  Neurological: She is alert. Coordination normal.  Skin: Skin is warm and dry. No rash noted. She is not diaphoretic. No pallor.  Psychiatric: She has a normal mood and  affect.  Nursing note and vitals reviewed.    ED Treatments / Results  DIAGNOSTIC STUDIES: Oxygen Saturation is 100% on RA, normal by my interpretation.    COORDINATION OF CARE: 2:43 PM- Pt advised of plan for treatment and pt agrees.  Labs (all labs ordered are listed, but only abnormal results are displayed) Labs Reviewed - No data to display  EKG  EKG Interpretation None       Radiology No results found.  Procedures Procedures (including critical care time)  Medications Ordered in ED Medications - No data to display   Initial Impression / Assessment and Plan / ED Course  I have reviewed the triage vital signs and the nursing notes.  Pertinent labs & imaging results that were available during my care of the patient were reviewed by me and considered in my medical decision making (see chart for details).     No emergent signs of hair loss recognized, however patient will need to follow up and establish care with a primary care provider and/or dermatology or endocrinologist for further workup of alopecia. Patient advised to avoid any hair products and treatment at this time. She advised to follow-up with [facility] to arrange this. Return precautions discussed. Patient understands and agrees with plan. Patient vitals stable throughout ED course and discharged in satisfactory condition patient also evaluated by Dr. Donnald Garre who guided the patient's management and agrees with plan.  Final Clinical Impressions(s) / ED Diagnoses   Final diagnoses:  Alopecia    New Prescriptions Discharge Medication List as of 03/23/2017  3:54 PM     I personally performed the services described in this documentation, which was scribed in my presence. The recorded information has been reviewed and is accurate.     Emi Holes, PA-C 03/23/17 1938    Arby Barrette, MD 03/29/17 [id number]

## 2017-03-23 NOTE — Discharge Instructions (Signed)
At this time, the cause of your hair loss is uncertain. On examination, it appears that the hair is breaking off rather than falling out. Try to avoid products and treatment. I recommend follow-up with a dermatologist and also possibly an endocrinologist (someone who specializes in hormone related conditions). [facility] to help you arrange referrals to specialist physicians.

## 2017-04-06 ENCOUNTER — Encounter (INDEPENDENT_AMBULATORY_CARE_PROVIDER_SITE_OTHER): Payer: Self-pay | Admitting: Physician Assistant

## 2017-04-06 ENCOUNTER — Ambulatory Visit (INDEPENDENT_AMBULATORY_CARE_PROVIDER_SITE_OTHER): Payer: Self-pay | Admitting: Physician Assistant

## 2017-04-06 VITALS — BP 123/77 | HR 93 | Temp 97.6°F | Ht 65.75 in | Wt 300.6 lb

## 2017-04-06 DIAGNOSIS — R631 Polydipsia: Secondary | ICD-10-CM

## 2017-04-06 DIAGNOSIS — B350 Tinea barbae and tinea capitis: Secondary | ICD-10-CM

## 2017-04-06 MED ORDER — TERBINAFINE HCL 250 MG PO TABS: 250.0000 mg | ORAL_TABLET | Freq: Every day | ORAL | 0 refills | Status: DC

## 2017-04-06 NOTE — Progress Notes (Signed)
NP presents for a HFU for alopecia Pt denies being in pain  Pt needs a referral to dermatology

## 2017-04-06 NOTE — Patient Instructions (Signed)
I have diagnosed you with tinea capititis. This is a fungal infection and you should take medications as directed. It will take several months for your hair to grow back but you should notice the loss of hair has stopped with the treatment prescribed. Please do not drink alcohol when using Terbinafine.

## 2017-04-06 NOTE — Progress Notes (Signed)
   Subjective:  Patient ID: Sheila Alvarez, female    DOB: 1991-05-04  Age: 26 y.o. MRN: [id number]  CC:  Hair loss   HPI Sheila Alvarez is a 26 y.o. female with a PMH of DM2 and asthma presents with loss of hair over two months. Hair loss is diffuse and located on the occiput and front hair line. She endorses itching. Denies suppuration, bleeding, f/c/n/v, or rash elsewhere.    Review of Systems  Constitutional: Negative for chills, fever and malaise/fatigue.  Eyes: Negative for blurred vision.  Respiratory: Negative for shortness of breath.   Cardiovascular: Negative for chest pain and palpitations.  Gastrointestinal: Negative for abdominal pain and nausea.  Genitourinary: Negative for dysuria and hematuria.  Musculoskeletal: Negative for joint pain and myalgias.  Skin: Positive for itching. Negative for rash.       Hair loss  Neurological: Negative for tingling and headaches.  Endo/Heme/Allergies: Positive for polydipsia.  Psychiatric/Behavioral: Negative for depression. The patient is not nervous/anxious.     Objective:  BP 123/77 (BP Location: Left Arm, Patient Position: Sitting, Cuff Size: Large)   Pulse 93   Temp 97.6 F (36.4 C) (Oral)   Ht 5' 5.75" (1.67 m)   Wt (!) 300 lb 9.6 oz (136.4 kg)   LMP 03/16/2017 (Exact Date)   SpO2 93%   BMI 48.89 kg/m   BP/Weight 04/06/2017 03/23/2017 08/15/2016  Systolic BP 123 150 112  Diastolic BP 77 70 93  Wt. (Lbs) 300.6 - 274  BMI 48.89 - 45.6      Physical Exam  Constitutional: She is oriented to person, place, and time.  Well developed, obese, NAD, polite  HENT:  Head: Normocephalic and atraumatic.  Eyes: No scleral icterus.  Neck: Normal range of motion. Neck supple. No thyromegaly present.  Cardiovascular: Normal rate, regular rhythm and normal heart sounds.   Pulmonary/Chest: Effort normal and breath sounds normal.  Abdominal: Soft. Bowel sounds are normal. There is no tenderness.  Musculoskeletal: She exhibits  no edema.  Neurological: She is alert and oriented to person, place, and time. No cranial nerve deficit. Coordination normal.  Skin: Skin is warm and dry. No rash noted. No erythema. No pallor.  Diffuse thinning and loss of hair on the occiput and front hair line. No areas of complete baldness.  Psychiatric: She has a normal mood and affect. Her behavior is normal. Thought content normal.  Vitals reviewed.    Assessment & Plan:   1. Tinea capitis - terbinafine (LAMISIL) 250 MG tablet; Take 1 tablet (250 mg total) by mouth daily.  Dispense: 42 tablet; Refill: 0 - TSH - Comprehensive metabolic panel  2. Polydipsia - return in one to two weeks to discuss diabetes. - Reports being told she is at prediabetic level at her last visit but no labs are seen on chart review.    Meds ordered this encounter  Medications  . terbinafine (LAMISIL) 250 MG tablet    Sig: Take 1 tablet (250 mg total) by mouth daily.    Dispense:  42 tablet    Refill:  0    Order Specific Question:   Supervising Provider    Answer:   Quentin Angst [id number]    Follow-up: Return in about 4 weeks (around 05/04/2017) for tinea capitis.   Loletta Specter PA

## 2017-04-07 LAB — COMPREHENSIVE METABOLIC PANEL
ALBUMIN: 3.8 g/dL (ref 3.5–5.5)
ALT: 16 IU/L (ref 0–32)
AST: 19 IU/L (ref 0–40)
Albumin/Globulin Ratio: 1.2 (ref 1.2–2.2)
Alkaline Phosphatase: 63 IU/L (ref 39–117)
BUN / CREAT RATIO: 11 (ref 9–23)
BUN: 7 mg/dL (ref 6–20)
Bilirubin Total: 0.4 mg/dL (ref 0.0–1.2)
CO2: 27 mmol/L (ref 18–29)
CREATININE: 0.61 mg/dL (ref 0.57–1.00)
Calcium: 8.7 mg/dL (ref 8.7–10.2)
Chloride: 101 mmol/L (ref 96–106)
GFR calc non Af Amer: 126 mL/min/{1.73_m2} (ref >59)
GFR, EST AFRICAN AMERICAN: 146 mL/min/{1.73_m2} (ref >59)
GLOBULIN, TOTAL: 3.2 g/dL (ref 1.5–4.5)
Glucose: 100 mg/dL — ABNORMAL HIGH (ref 65–99)
Potassium: 4.6 mmol/L (ref 3.5–5.2)
SODIUM: 140 mmol/L (ref 134–144)
TOTAL PROTEIN: 7 g/dL (ref 6.0–8.5)

## 2017-04-07 LAB — TSH: TSH: 2.73 u[IU]/mL (ref 0.450–4.500)

## 2017-04-13 ENCOUNTER — Ambulatory Visit (INDEPENDENT_AMBULATORY_CARE_PROVIDER_SITE_OTHER): Payer: Self-pay | Admitting: Physician Assistant

## 2017-04-23 ENCOUNTER — Ambulatory Visit (INDEPENDENT_AMBULATORY_CARE_PROVIDER_SITE_OTHER): Payer: Self-pay | Admitting: Physician Assistant

## 2017-04-23 ENCOUNTER — Encounter (INDEPENDENT_AMBULATORY_CARE_PROVIDER_SITE_OTHER): Payer: Self-pay | Admitting: Physician Assistant

## 2017-04-23 VITALS — BP 119/77 | HR 84 | Temp 98.1°F | Ht 65.0 in | Wt 295.8 lb

## 2017-04-23 DIAGNOSIS — B350 Tinea barbae and tinea capitis: Secondary | ICD-10-CM

## 2017-04-23 DIAGNOSIS — R631 Polydipsia: Secondary | ICD-10-CM

## 2017-04-23 DIAGNOSIS — R7303 Prediabetes: Secondary | ICD-10-CM

## 2017-04-23 DIAGNOSIS — R3589 Other polyuria: Secondary | ICD-10-CM

## 2017-04-23 DIAGNOSIS — R5383 Other fatigue: Secondary | ICD-10-CM

## 2017-04-23 DIAGNOSIS — R358 Other polyuria: Secondary | ICD-10-CM

## 2017-04-23 LAB — POCT GLYCOSYLATED HEMOGLOBIN (HGB A1C): Hemoglobin A1C: 6

## 2017-04-23 NOTE — Progress Notes (Signed)
Subjective:  Patient ID: Sheila Alvarez, female    DOB: [birth year]  Age: 26 y.o. MRN: [id number]  CC: f/u polydipsia and tinea  HPI Sheila Alvarez is a 26 y.o. female returns on f/u of tinea capitis. States that she feels as thought her hair looks the same. Mother states her hair is growing back. Taking Terbinafine daily as directed. Does not drink alcohol and denies side effects. Denies CP, SOB, HA, abdominal pain, f/c/n/v, rash, swelling, or GI/GU sxs.  Says she continues with polydipsia, polyuria, fatigue, visual blurring. Denies tingling, numbness, vaginal yeast infection.     Outpatient Medications Prior to Visit  Medication Sig Dispense Refill  . ibuprofen (ADVIL,MOTRIN) 200 MG tablet Take 400 mg by mouth every 6 (six) hours as needed (pain).    Marland Kitchen terbinafine (LAMISIL) 250 MG tablet Take 1 tablet (250 mg total) by mouth daily. 42 tablet 0  . naproxen (NAPROSYN) 250 MG tablet Take 1 tablet (250 mg total) by mouth 2 (two) times daily with a meal. (Patient not taking: Reported on 08/15/2016) 30 tablet 0  . ondansetron (ZOFRAN) 4 MG tablet Take 1 tablet (4 mg total) by mouth every 6 (six) hours. (Patient not taking: Reported on 08/15/2016) 20 tablet 0   No facility-administered medications prior to visit.      ROS Review of Systems  Constitutional: Positive for malaise/fatigue. Negative for chills and fever.  Eyes: Positive for blurred vision.  Respiratory: Negative for shortness of breath.   Cardiovascular: Negative for chest pain and palpitations.  Gastrointestinal: Negative for abdominal pain and nausea.  Genitourinary: Negative for dysuria and hematuria.  Musculoskeletal: Negative for joint pain and myalgias.  Skin: Negative for rash.  Neurological: Negative for tingling and headaches.  Endo/Heme/Allergies: Positive for polydipsia.  Psychiatric/Behavioral: Negative for depression. The patient is not nervous/anxious.     Objective:  BP 119/77 (BP Location: Left Arm,  Patient Position: Sitting, Cuff Size: Large)   Pulse 84   Temp 98.1 F (36.7 C) (Oral)   Ht  (1.651 m)   Wt 295 lb 12.8 oz (134.2 kg)   LMP 04/14/2017 (Approximate) Comment: patient has had two cycles this monrh   SpO2 96%   BMI 49.22 kg/m   BP/Weight 04/23/2017 04/06/2017 03/23/2017  Systolic BP 119 123 150  Diastolic BP 77 77 70  Wt. (Lbs) 295.8 300.6 -  BMI 49.22 48.89 -      Physical Exam  Constitutional: She is oriented to person, place, and time.  Well developed, obese, NAD, polite  HENT:  Head: Normocephalic and atraumatic.  Eyes: No scleral icterus.  Neck: Normal range of motion. Neck supple. No thyromegaly present.  Cardiovascular: Normal rate, regular rhythm and normal heart sounds.   Pulmonary/Chest: Effort normal and breath sounds normal.  Abdominal: Soft. Bowel sounds are normal. She exhibits no distension and no mass. There is no tenderness. There is no rebound and no guarding.  Musculoskeletal: She exhibits no edema.  Neurological: She is alert and oriented to person, place, and time.  Skin: Skin is warm and dry. No rash noted. No erythema. No pallor.  Patches of alopecia appear somewhat fuller with hair growth  Psychiatric: She has a normal mood and affect. Her behavior is normal. Thought content normal.  Vitals reviewed.    Assessment & Plan:   1. Prediabetes - A1c 6.0% in clinic today - Advised pt on diet and exercise. - Return in six months for A1c   2. Polydipsia  3. Polyuria  4.  Fatigue, unspecified type  5. Tinea capitis - Continue on Terbinafine - Return in two months for LFTs   Follow-up: Return in about 6 months (around 10/23/2017).   Loletta Specter PA

## 2017-04-23 NOTE — Patient Instructions (Signed)
Prediabetes Prediabetes is the condition of having a blood sugar (blood glucose) level that is higher than it should be, but not high enough for you to be diagnosed with type 2 diabetes. Having prediabetes puts you at risk for developing type 2 diabetes (type 2 diabetes mellitus). Prediabetes may be called impaired glucose tolerance or impaired fasting glucose. Prediabetes usually does not cause symptoms. Your health care provider can diagnose this condition with blood tests. You may be tested for prediabetes if you are overweight and if you have at least one other risk factor for prediabetes. Risk factors for prediabetes include:  Having a family member with type 2 diabetes.  Being overweight or obese.  Being older than age 45.  Being of American-Indian, African-American, Hispanic/Latino, or Asian/Pacific Islander descent.  Having an inactive (sedentary) lifestyle.  Having a history of gestational diabetes or polycystic ovarian syndrome (PCOS).  Having low levels of good cholesterol (HDL-C) or high levels of blood fats (triglycerides).  Having high blood pressure. What is blood glucose and how is blood glucose measured?   Blood glucose refers to the amount of glucose in your bloodstream. Glucose comes from eating foods that contain sugars and starches (carbohydrates) that the body breaks down into glucose. Your blood glucose level may be measured in mg/dL (milligrams per deciliter) or mmol/L (millimoles per liter).Your blood glucose may be checked with one or more of the following blood tests:  A fasting blood glucose (FBG) test. You will not be allowed to eat (you will fast) for at least 8 hours before a blood sample is taken.  A normal range for FBG is 70-100 mg/dl (3.9-5.6 mmol/L).  An A1c (hemoglobin A1c) blood test. This test provides information about blood glucose control over the previous 2?3months.  An oral glucose tolerance test (OGTT). This test measures your blood glucose  twice:  After fasting. This is your baseline level.  Two hours after you drink a beverage that contains glucose. You may be diagnosed with prediabetes:  If your FBG is 100?125 mg/dL (5.6-6.9 mmol/L).  If your A1c level is 5.7?6.4%.  If your OGGT result is 140?199 mg/dL (7.8-11 mmol/L). These blood tests may be repeated to confirm your diagnosis. What happens if blood glucose is too high? The pancreas produces a hormone (insulin) that helps move glucose from the bloodstream into cells. When cells in the body do not respond properly to insulin that the body makes (insulin resistance), excess glucose builds up in the blood instead of going into cells. As a result, high blood glucose (hyperglycemia) can develop, which can cause many complications. This is a symptom of prediabetes. What can happen if blood glucose stays higher than normal for a long time? Having high blood glucose for a long time is dangerous. Too much glucose in your blood can damage your nerves and blood vessels. Long-term damage can lead to complications from diabetes, which may include:  Heart disease.  Stroke.  Blindness.  Kidney disease.  Depression.  Poor circulation in the feet and legs, which could lead to surgical removal (amputation) in severe cases. How can prediabetes be prevented from turning into type 2 diabetes?   To help prevent type 2 diabetes, take the following actions:  Be physically active.  Do moderate-intensity physical activity for at least 30 minutes on at least 5 days of the week, or as much as told by your health care provider. This could be brisk walking, biking, or water aerobics.  Ask your health care provider what activities are   safe for you. A mix of physical activities may be best, such as walking, swimming, cycling, and strength training.  Lose weight as told by your health care provider.  Losing 5-7% of your body weight can reverse insulin resistance.  Your health care  provider can determine how much weight loss is best for you and can help you lose weight safely.  Follow a healthy meal plan. This includes eating lean proteins, complex carbohydrates, fresh fruits and vegetables, low-fat dairy products, and healthy fats.  Follow instructions from your health care provider about eating or drinking restrictions.  Make an appointment to see a diet and nutrition specialist (registered dietitian) to help you create a healthy eating plan that is right for you.  Do not smoke or use any tobacco products, such as cigarettes, chewing tobacco, and e-cigarettes. If you need help quitting, ask your health care provider.  Take over-the-counter and prescription medicines as told by your health care provider. You may be prescribed medicines that help lower the risk of type 2 diabetes. This information is not intended to replace advice given to you by your health care provider. Make sure you discuss any questions you have with your health care provider. Document Released: 04/07/2016 Document Revised: 05/22/2016 Document Reviewed: 02/05/2016 [employer] Interactive Patient Education  2017 [employer]  

## 2017-04-27 ENCOUNTER — Ambulatory Visit: Payer: Self-pay | Attending: Internal Medicine

## 2017-06-22 ENCOUNTER — Ambulatory Visit (INDEPENDENT_AMBULATORY_CARE_PROVIDER_SITE_OTHER): Payer: Self-pay | Admitting: Physician Assistant

## 2017-10-19 ENCOUNTER — Ambulatory Visit (INDEPENDENT_AMBULATORY_CARE_PROVIDER_SITE_OTHER): Payer: Self-pay | Admitting: Physician Assistant

## 2018-01-01 ENCOUNTER — Other Ambulatory Visit: Payer: Self-pay

## 2018-01-01 ENCOUNTER — Ambulatory Visit (INDEPENDENT_AMBULATORY_CARE_PROVIDER_SITE_OTHER): Payer: Self-pay | Admitting: Physician Assistant

## 2018-01-01 ENCOUNTER — Encounter (INDEPENDENT_AMBULATORY_CARE_PROVIDER_SITE_OTHER): Payer: Self-pay | Admitting: Physician Assistant

## 2018-01-01 VITALS — BP 124/84 | HR 89 | Temp 98.2°F | Wt 295.2 lb

## 2018-01-01 DIAGNOSIS — B350 Tinea barbae and tinea capitis: Secondary | ICD-10-CM

## 2018-01-01 DIAGNOSIS — R7303 Prediabetes: Secondary | ICD-10-CM

## 2018-01-01 DIAGNOSIS — Z79899 Other long term (current) drug therapy: Secondary | ICD-10-CM

## 2018-01-01 DIAGNOSIS — Z3200 Encounter for pregnancy test, result unknown: Secondary | ICD-10-CM

## 2018-01-01 LAB — POCT URINE PREGNANCY: PREG TEST UR: NEGATIVE

## 2018-01-01 LAB — POCT GLYCOSYLATED HEMOGLOBIN (HGB A1C): Hemoglobin A1C: 5.9

## 2018-01-01 NOTE — Patient Instructions (Signed)
Diabetes Mellitus and Nutrition When you have diabetes (diabetes mellitus), it is very important to have healthy eating habits because your blood sugar (glucose) levels are greatly affected by what you eat and drink. Eating healthy foods in the appropriate amounts, at about the same times every day, can help you:  Control your blood glucose.  Lower your risk of heart disease.  Improve your blood pressure.  Reach or maintain a healthy weight.  Every person with diabetes is different, and each person has different needs for a meal plan. Your health care provider may recommend that you work with a diet and nutrition specialist (dietitian) to make a meal plan that is best for you. Your meal plan may vary depending on factors such as:  The calories you need.  The medicines you take.  Your weight.  Your blood glucose, blood pressure, and cholesterol levels.  Your activity level.  Other health conditions you have, such as heart or kidney disease.  How do carbohydrates affect me? Carbohydrates affect your blood glucose level more than any other type of food. Eating carbohydrates naturally increases the amount of glucose in your blood. Carbohydrate counting is a method for keeping track of how many carbohydrates you eat. Counting carbohydrates is important to keep your blood glucose at a healthy level, especially if you use insulin or take certain oral diabetes medicines. It is important to know how many carbohydrates you can safely have in each meal. This is different for every person. Your dietitian can help you calculate how many carbohydrates you should have at each meal and for snack. Foods that contain carbohydrates include:  Bread, cereal, rice, pasta, and crackers.  Potatoes and corn.  Peas, beans, and lentils.  Milk and yogurt.  Fruit and juice.  Desserts, such as cakes, cookies, ice cream, and candy.  How does alcohol affect me? Alcohol can cause a sudden decrease in blood  glucose (hypoglycemia), especially if you use insulin or take certain oral diabetes medicines. Hypoglycemia can be a life-threatening condition. Symptoms of hypoglycemia (sleepiness, dizziness, and confusion) are similar to symptoms of having too much alcohol. If your health care provider says that alcohol is safe for you, follow these guidelines:  Limit alcohol intake to no more than 1 drink per day for nonpregnant women and 2 drinks per day for men. One drink equals 12 oz of beer, 5 oz of wine, or 1 oz of hard liquor.  Do not drink on an empty stomach.  Keep yourself hydrated with water, diet soda, or unsweetened iced tea.  Keep in mind that regular soda, juice, and other mixers may contain a lot of sugar and must be counted as carbohydrates.  What are tips for following this plan? Reading food labels  Start by checking the serving size on the label. The amount of calories, carbohydrates, fats, and other nutrients listed on the label are based on one serving of the food. Many foods contain more than one serving per package.  Check the total grams (g) of carbohydrates in one serving. You can calculate the number of servings of carbohydrates in one serving by dividing the total carbohydrates by 15. For example, if a food has 30 g of total carbohydrates, it would be equal to 2 servings of carbohydrates.  Check the number of grams (g) of saturated and trans fats in one serving. Choose foods that have low or no amount of these fats.  Check the number of milligrams (mg) of sodium in one serving. Most people   should limit total sodium intake to less than 2,300 mg per day.  Always check the nutrition information of foods labeled as "low-fat" or "nonfat". These foods may be higher in added sugar or refined carbohydrates and should be avoided.  Talk to your dietitian to identify your daily goals for nutrients listed on the label. Shopping  Avoid buying canned, premade, or processed foods. These  foods tend to be high in fat, sodium, and added sugar.  Shop around the outside edge of the grocery store. This includes fresh fruits and vegetables, bulk grains, fresh meats, and fresh dairy. Cooking  Use low-heat cooking methods, such as baking, instead of high-heat cooking methods like deep frying.  Cook using healthy oils, such as olive, canola, or sunflower oil.  Avoid cooking with butter, cream, or high-fat meats. Meal planning  Eat meals and snacks regularly, preferably at the same times every day. Avoid going long periods of time without eating.  Eat foods high in fiber, such as fresh fruits, vegetables, beans, and whole grains. Talk to your dietitian about how many servings of carbohydrates you can eat at each meal.  Eat 4-6 ounces of lean protein each day, such as lean meat, chicken, fish, eggs, or tofu. 1 ounce is equal to 1 ounce of meat, chicken, or fish, 1 egg, or 1/4 cup of tofu.  Eat some foods each day that contain healthy fats, such as avocado, nuts, seeds, and fish. Lifestyle   Check your blood glucose regularly.  Exercise at least 30 minutes 5 or more days each week, or as told by your health care provider.  Take medicines as told by your health care provider.  Do not use any products that contain nicotine or tobacco, such as cigarettes and e-cigarettes. If you need help quitting, ask your health care provider.  Work with a counselor or diabetes educator to identify strategies to manage stress and any emotional and social challenges. What are some questions to ask my health care provider?  Do I need to meet with a diabetes educator?  Do I need to meet with a dietitian?  What number can I call if I have questions?  When are the best times to check my blood glucose? Where to find more information:  American Diabetes Association: diabetes.org/food-and-fitness/food  Academy of Nutrition and Dietetics:  www.eatright.org/resources/health/diseases-and-conditions/diabetes  National Institute of Diabetes and Digestive and Kidney Diseases (NIH): www.niddk.nih.gov/health-information/diabetes/overview/diet-eating-physical-activity Summary  A healthy meal plan will help you control your blood glucose and maintain a healthy lifestyle.  Working with a diet and nutrition specialist (dietitian) can help you make a meal plan that is best for you.  Keep in mind that carbohydrates and alcohol have immediate effects on your blood glucose levels. It is important to count carbohydrates and to use alcohol carefully. This information is not intended to replace advice given to you by your health care provider. Make sure you discuss any questions you have with your health care provider. Document Released: 09/11/2005 Document Revised: 01/19/2017 Document Reviewed: 01/19/2017 Elsevier Interactive Patient Education  2018 Elsevier Inc.  

## 2018-01-01 NOTE — Progress Notes (Signed)
   Subjective:  Patient ID: Sheila Alvarez Alvarez, female    DOB: 01/05/1991  Age: 27 y.o. MRN: [id number]  CC: alopecia, possible pregnancy  HPI Sheila Alvarez Alvarez is a 27 y.o. female with a medical history of asthma, prediabetes, and tinea capitis presents to f/u on tinea capitis. Took her terbinafine as directed and says her hair has grown in. No further symptoms, she is happy with the result. Did not have side effects with terbinafine.     Would like a pregnancy test done. She is concerned of pregnancy after having unprotected sex one week ago. Has taken two at home pregnancy tests which were negative. Does not endorse any other symptoms or complaints.            Outpatient Medications Prior to Visit  Medication Sig Dispense Refill  . ibuprofen (ADVIL,MOTRIN) 200 MG tablet Take 400 mg by mouth every 6 (six) hours as needed (pain).    Marland Kitchen. terbinafine (LAMISIL) 250 MG tablet Take 1 tablet (250 mg total) by mouth daily. (Patient not taking: Reported on 01/01/2018) 42 tablet 0   No facility-administered medications prior to visit.      ROS Review of Systems  Constitutional: Negative for chills, fever and malaise/fatigue.  Eyes: Negative for blurred vision.  Respiratory: Negative for shortness of breath.   Cardiovascular: Negative for chest pain and palpitations.  Gastrointestinal: Negative for abdominal pain and nausea.  Genitourinary: Negative for dysuria and hematuria.  Musculoskeletal: Negative for joint pain and myalgias.  Skin: Negative for rash.  Neurological: Negative for tingling and headaches.  Psychiatric/Behavioral: Negative for depression. The patient is not nervous/anxious.     Objective:  Wt 295 lb 3.2 oz (133.9 kg)   LMP 12/24/2017 (Exact Date)   BMI 49.12 kg/m   BP/Weight 01/01/2018 04/23/2017 04/06/2017  Systolic BP - 119 123  Diastolic BP - 77 77  Wt. (Lbs) 295.2 295.8 300.6  BMI 49.12 49.22 48.89      Physical Exam  Constitutional: She is oriented to person,  place, and time.  Well developed, obese, NAD, polite  HENT:  Head: Normocephalic and atraumatic.  Eyes: No scleral icterus.  Neck: Normal range of motion. Neck supple. No thyromegaly present.  Cardiovascular: Normal rate, regular rhythm and normal heart sounds.  Pulmonary/Chest: Effort normal and breath sounds normal.  Musculoskeletal: She exhibits no edema.  Neurological: She is alert and oriented to person, place, and time.  Skin: Skin is warm and dry. No rash noted. No erythema. No pallor.  Psychiatric: She has a normal mood and affect. Her behavior is normal. Thought content normal.  Vitals reviewed.    Assessment & Plan:    1. Possible pregnancy - POCT urine pregnancy negative in clinic today.  2. Prediabetes - HgB A1c 5.9% in clinic today  3. Tinea capitis - Resolved with Terbinafine  4. High risk medication use - Comprehensive metabolic panel    Follow-up: Return in about 6 months (around 07/01/2018) for prediabetes.   Loletta Specteroger David Gertie Broerman PA

## 2018-01-02 LAB — COMPREHENSIVE METABOLIC PANEL
A/G RATIO: 1.3 (ref 1.2–2.2)
ALBUMIN: 4.1 g/dL (ref 3.5–5.5)
ALK PHOS: 65 IU/L (ref 39–117)
ALT: 18 IU/L (ref 0–32)
AST: 24 IU/L (ref 0–40)
BUN / CREAT RATIO: 14 (ref 9–23)
BUN: 9 mg/dL (ref 6–20)
Bilirubin Total: 0.7 mg/dL (ref 0.0–1.2)
CALCIUM: 8.9 mg/dL (ref 8.7–10.2)
CO2: 22 mmol/L (ref 20–29)
CREATININE: 0.66 mg/dL (ref 0.57–1.00)
Chloride: 102 mmol/L (ref 96–106)
GFR calc Af Amer: 141 mL/min/{1.73_m2} (ref >59)
GFR, EST NON AFRICAN AMERICAN: 122 mL/min/{1.73_m2} (ref >59)
GLOBULIN, TOTAL: 3.2 g/dL (ref 1.5–4.5)
Glucose: 70 mg/dL (ref 65–99)
POTASSIUM: 5.5 mmol/L — AB (ref 3.5–5.2)
SODIUM: 139 mmol/L (ref 134–144)
Total Protein: 7.3 g/dL (ref 6.0–8.5)

## 2018-01-05 ENCOUNTER — Telehealth (INDEPENDENT_AMBULATORY_CARE_PROVIDER_SITE_OTHER): Payer: Self-pay

## 2018-01-05 NOTE — Telephone Encounter (Signed)
-----   Message from Loletta Specteroger David Gomez, PA-C sent at 01/04/2018  8:47 AM EST ----- Potassium is slightly high. Decrease potassium rich foods.

## 2018-01-05 NOTE — Telephone Encounter (Signed)
Patient is aware of slightly elevated potassium, and to decrease foods rich in potassium. Sheila Alvarez, CMA

## 2018-01-27 ENCOUNTER — Other Ambulatory Visit: Payer: Self-pay

## 2018-01-27 ENCOUNTER — Encounter (HOSPITAL_COMMUNITY): Payer: Self-pay | Admitting: Emergency Medicine

## 2018-01-27 ENCOUNTER — Emergency Department (HOSPITAL_COMMUNITY)
Admission: EM | Admit: 2018-01-27 | Discharge: 2018-01-28 | Disposition: A | Discharge status: Home / Self Care | Payer: Self-pay | Attending: Emergency Medicine | Admitting: Emergency Medicine

## 2018-01-27 DIAGNOSIS — Z5321 Procedure and treatment not carried out due to patient leaving prior to being seen by health care provider: Secondary | ICD-10-CM | POA: Insufficient documentation

## 2018-01-27 DIAGNOSIS — R21 Rash and other nonspecific skin eruption: Secondary | ICD-10-CM | POA: Insufficient documentation

## 2018-01-27 NOTE — ED Notes (Signed)
Pts name called for a room no answer 

## 2018-01-27 NOTE — ED Triage Notes (Signed)
Pt presents to ED for assessment of excoriation marks to his bilateral forearms, patient states she woke up this morning to itchy arms and welts.  States she used a new lotion last night. Excoriation marks have odd presentation.

## 2018-02-21 ENCOUNTER — Emergency Department (HOSPITAL_COMMUNITY)
Admission: EM | Admit: 2018-02-21 | Discharge: 2018-02-21 | Disposition: A | Discharge status: Home / Self Care | Payer: Self-pay | Attending: Emergency Medicine | Admitting: Emergency Medicine

## 2018-02-21 ENCOUNTER — Emergency Department (HOSPITAL_COMMUNITY): Payer: Self-pay

## 2018-02-21 ENCOUNTER — Encounter (HOSPITAL_COMMUNITY): Payer: Self-pay

## 2018-02-21 DIAGNOSIS — J45909 Unspecified asthma, uncomplicated: Secondary | ICD-10-CM | POA: Insufficient documentation

## 2018-02-21 DIAGNOSIS — E119 Type 2 diabetes mellitus without complications: Secondary | ICD-10-CM | POA: Insufficient documentation

## 2018-02-21 DIAGNOSIS — N644 Mastodynia: Secondary | ICD-10-CM | POA: Insufficient documentation

## 2018-02-21 LAB — I-STAT BETA HCG BLOOD, ED (MC, WL, AP ONLY): I-stat hCG, quantitative: <5 m[IU]/mL (ref <5)

## 2018-02-21 LAB — CBC
HCT: 40.6 % (ref 36.0–46.0)
HEMOGLOBIN: 13.5 g/dL (ref 12.0–15.0)
MCH: 24.1 pg — AB (ref 26.0–34.0)
MCHC: 33.3 g/dL (ref 30.0–36.0)
MCV: 72.5 fL — AB (ref 78.0–100.0)
Platelets: 324 10*3/uL (ref 150–400)
RBC: 5.6 MIL/uL — AB (ref 3.87–5.11)
RDW: 15.7 % — ABNORMAL HIGH (ref 11.5–15.5)
WBC: 6.1 10*3/uL (ref 4.0–10.5)

## 2018-02-21 LAB — BASIC METABOLIC PANEL
ANION GAP: 11 (ref 5–15)
BUN: 8 mg/dL (ref 6–20)
CHLORIDE: 103 mmol/L (ref 101–111)
CO2: 23 mmol/L (ref 22–32)
Calcium: 9.1 mg/dL (ref 8.9–10.3)
Creatinine, Ser: 0.76 mg/dL (ref 0.44–1.00)
GFR calc Af Amer: >60 mL/min (ref >60)
GFR calc non Af Amer: >60 mL/min (ref >60)
GLUCOSE: 74 mg/dL (ref 65–99)
Potassium: 3.7 mmol/L (ref 3.5–5.1)
Sodium: 137 mmol/L (ref 135–145)

## 2018-02-21 LAB — I-STAT TROPONIN, ED: Troponin i, poc: 0 ng/mL (ref 0.00–0.08)

## 2018-02-21 MED ORDER — CYCLOBENZAPRINE HCL 10 MG PO TABS: 10.0000 mg | ORAL_TABLET | Freq: Two times a day (BID) | ORAL | 0 refills | Status: DC | PRN

## 2018-02-21 MED ORDER — NAPROXEN 500 MG PO TABS: 500.0000 mg | ORAL_TABLET | Freq: Two times a day (BID) | ORAL | 0 refills | Status: DC

## 2018-02-21 MED ORDER — KETOROLAC TROMETHAMINE 60 MG/2ML IM SOLN
60.0000 mg | Freq: Once | INTRAMUSCULAR | Status: AC
  Administered 2018-02-21: 60 mg via INTRAMUSCULAR
  Filled 2018-02-21: qty 2

## 2018-02-21 MED ORDER — OXYCODONE-ACETAMINOPHEN 5-325 MG PO TABS
1.0000 | ORAL_TABLET | Freq: Once | ORAL | Status: AC
  Administered 2018-02-21: 1 via ORAL
  Filled 2018-02-21: qty 1

## 2018-02-21 NOTE — Discharge Instructions (Signed)
Take the medication as directed for muscle pain. Call your doctor tomorrow for follow so he can recheck you and determine if he needs to order and ultrasound of your breast.

## 2018-02-21 NOTE — ED Provider Notes (Signed)
[facility] EMERGENCY DEPARTMENT Provider Note   CSN: [id number] Arrival date & time: 02/21/18  1906     History   Chief Complaint Chief Complaint  Patient presents with  . Breast Pain    HPI Sheila Alvarez is a 27 y.o. female who presents to the ED with breast pain. The pain is located in the right breast. The pain started last week but got better but got worse again today. Patient reports pain with palpation,deep breath and movement. She thinks she felt a lump today. Patient has never had problems with her breast and has not had a mammogram.   HPI  Past Medical History:  Diagnosis Date  . Asthma   . Diabetes mellitus without complication (HCC)     There are no active problems to display for this patient.   Past Surgical History:  Procedure Laterality Date  . ADENOIDECTOMY    . BACK SURGERY    . TONSILLECTOMY      OB History    No data available       Home Medications    Prior to Admission medications   Medication Sig Start Date End Date Taking? Authorizing Provider  cyclobenzaprine (FLEXERIL) 10 MG tablet Take 1 tablet (10 mg total) by mouth 2 (two) times daily as needed for muscle spasms. 02/21/18   Janne Napoleon, NP  naproxen (NAPROSYN) 500 MG tablet Take 1 tablet (500 mg total) by mouth 2 (two) times daily. 02/21/18   Janne Napoleon, NP    Family History Family History  Problem Relation Age of Onset  . Diabetes Mother   . Hypertension Mother   . Cancer Mother     Social History Social History   Tobacco Use  . Smoking status: Never Smoker  . Smokeless tobacco: Never Used  Substance Use Topics  . Alcohol use: Yes    Comment: ocassionally - vodka  . Drug use: No     Allergies   Patient has no known allergies.   Review of Systems Review of Systems  Cardiovascular: Positive for chest pain.  All other systems reviewed and are negative.    Physical Exam Updated Vital Signs BP 120/88   Pulse 88   Temp (!) 97.5 F  (36.4 C) (Oral)   Resp 16   LMP 02/13/2018   SpO2 97%   Physical Exam  Constitutional: No distress.  obese  HENT:  Head: Normocephalic.  Eyes: EOM are normal.  Neck: Neck supple.  Cardiovascular: Normal rate and regular rhythm.  Pulmonary/Chest: Effort normal and breath sounds normal. Right breast exhibits tenderness. Right breast exhibits no nipple discharge and no skin change.  Patient with large breast. No axillary mass palpated. Tenderness to the right breast with palpation, no definite mass palpated. No erythema or increased warmth of the right breast. The pain increases with palpation or when the patient moves.   Abdominal: Soft. There is no tenderness.  Genitourinary: No breast discharge or bleeding.  Musculoskeletal: Normal range of motion.  Neurological: She is alert.  Skin: Skin is warm and dry.  Psychiatric: She has a normal mood and affect.  Nursing note and vitals reviewed.    ED Treatments / Results  Labs (all labs ordered are listed, but only abnormal results are displayed) Labs Reviewed  CBC - Abnormal; Notable for the following components:      Result Value   RBC 5.60 (*)    MCV 72.5 (*)    MCH 24.1 (*)  RDW 15.7 (*)    All other components within normal limits  BASIC METABOLIC PANEL  I-STAT TROPONIN, ED  I-STAT BETA HCG BLOOD, ED ([facility], [facility], [facility] ONLY)    Radiology Dg Chest 2 View  Result Date: 02/21/2018 CLINICAL DATA:  Acute RIGHT chest pain for 1 week. EXAM: CHEST  2 VIEW COMPARISON:  08/15/2016 and 09/24/2014 radiographs FINDINGS: Cardiomediastinal silhouette is unchanged and unremarkable. Mild peribronchial thickening again noted. There is no evidence of focal airspace disease, pulmonary edema, suspicious pulmonary nodule/mass, pleural effusion, or pneumothorax. No acute bony abnormalities are identified. IMPRESSION: No active cardiopulmonary disease. Electronically Signed   By: Harmon PierJeffrey  Hu M.D.   On: 02/21/2018 19:52    Procedures Procedures  (including critical care time)  Medications Ordered in ED Medications  oxyCODONE-acetaminophen (PERCOCET/ROXICET) 5-325 MG per tablet 1 tablet (1 tablet Oral Given 02/21/18 2050)  ketorolac (TORADOL) injection 60 mg (60 mg Intramuscular Given 02/21/18 2051)   I discussed this case with DR. Knapp.  Initial Impression / Assessment and Plan / ED Course  I have reviewed the triage vital signs and the nursing notes.  27 y.o. female with right chest/breast pain that started a week ago got better and then returned today. Patient is afebrile and no cardiac findings. Will treat for chest wall pain and patient will call her PCP tomorrow for follow up. Patient stable for d/c with pain relieved with treatment while in the ED.  Final Clinical Impressions(s) / ED Diagnoses   Final diagnoses:  Breast pain    ED Discharge Orders        Ordered    naproxen (NAPROSYN) 500 MG tablet  2 times daily     02/21/18 2134    cyclobenzaprine (FLEXERIL) 10 MG tablet  2 times daily PRN     02/21/18 2134       Kerrie Buffaloeese, [place] Town LineM, NP 02/21/18 2142    Linwood DibblesKnapp, Jon, MD 02/21/18 2214

## 2018-02-21 NOTE — ED Triage Notes (Signed)
Pt states that she began to have R breast pain last week, today pain returned, states pain on R side of chest, hurt to take a deep breath and she believes she may have a lump on her breast.

## 2018-02-21 NOTE — ED Notes (Signed)
ED Provider at bedside. 

## 2018-12-11 IMAGING — DX DG CHEST 2V
2 series · 2 of 2 positions shown · non-contrast
Comparison: 08/15/2016 and 09/24/2014 radiographs

CLINICAL DATA: Acute RIGHT chest pain for 1 week.

EXAM:
CHEST  2 VIEW

[chest pa]
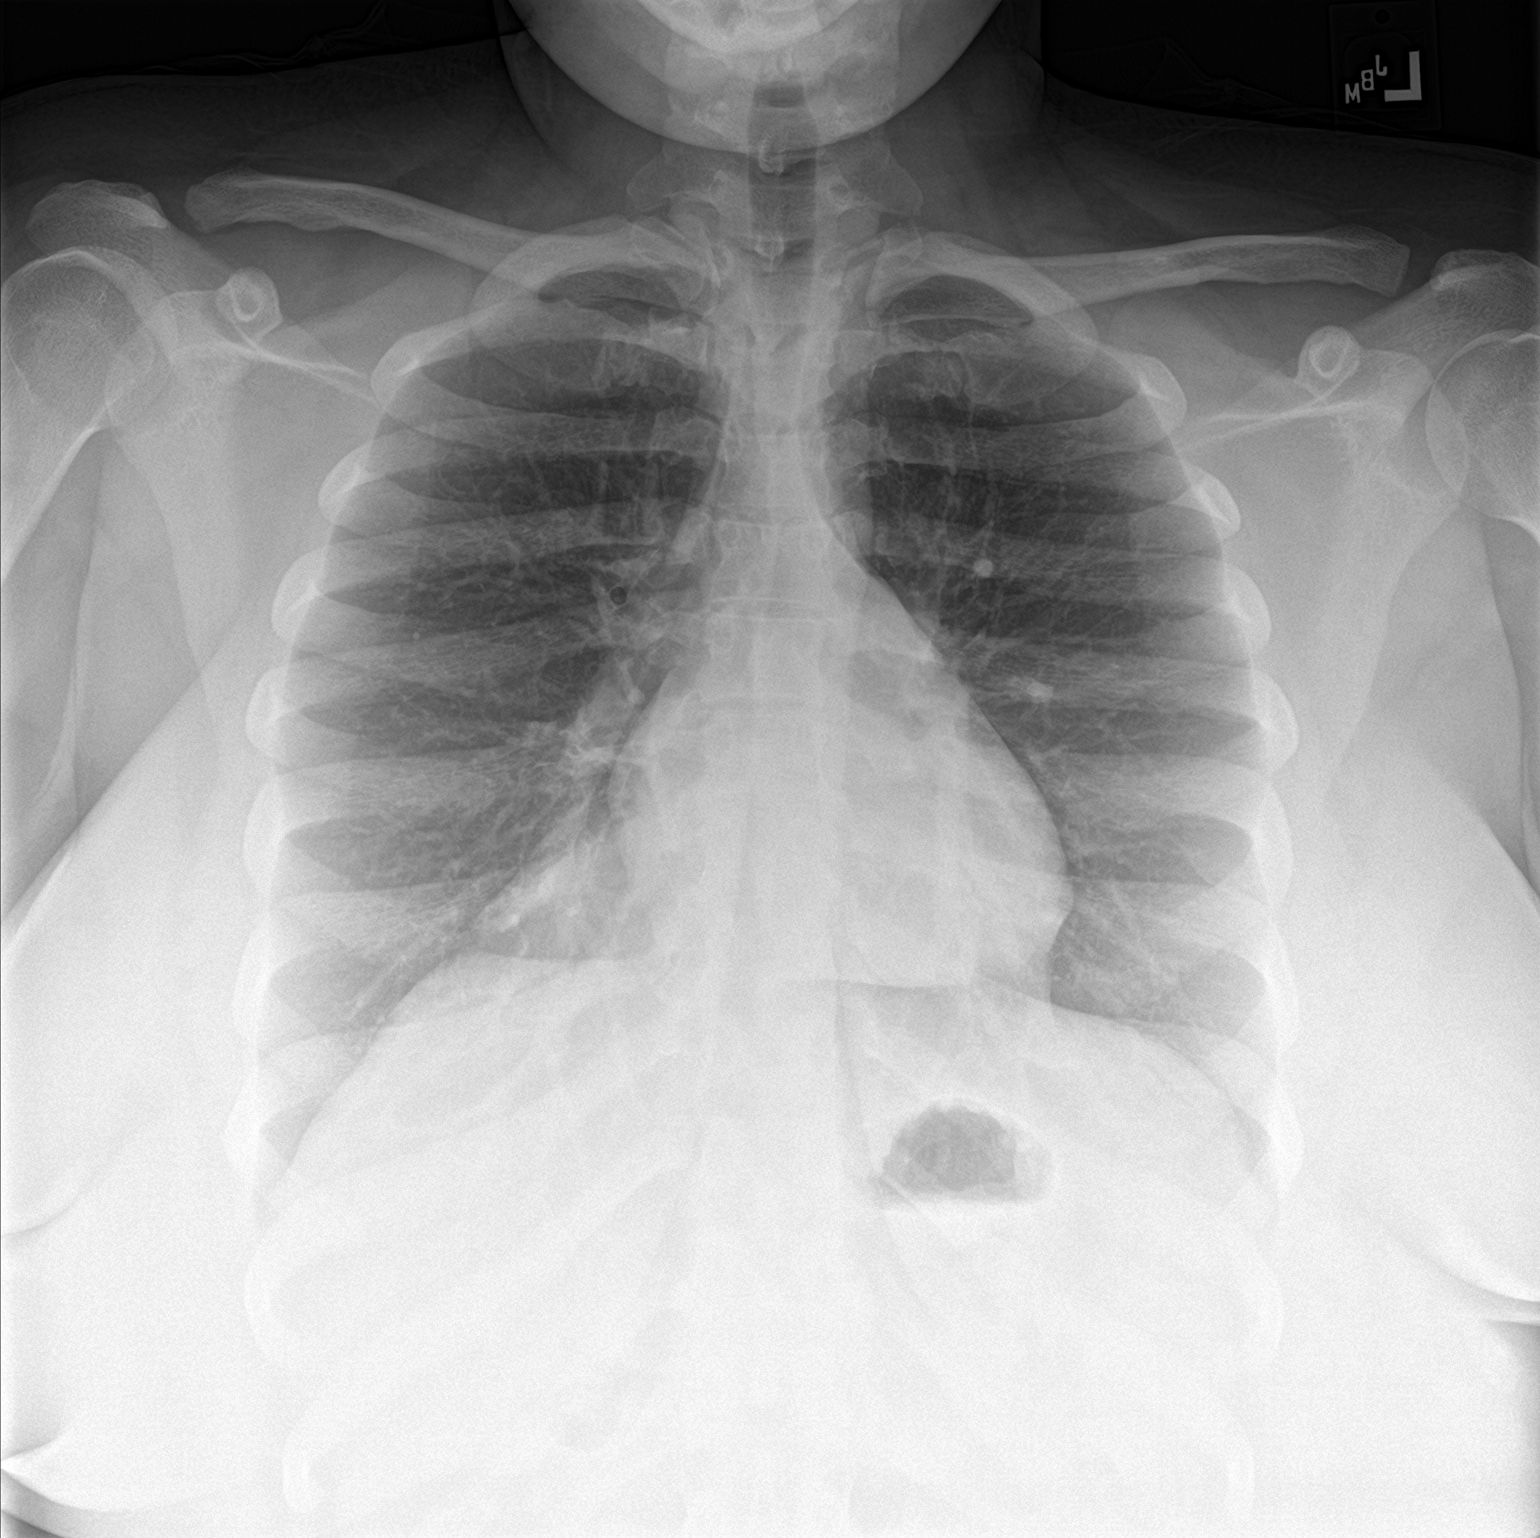

[chest lat]
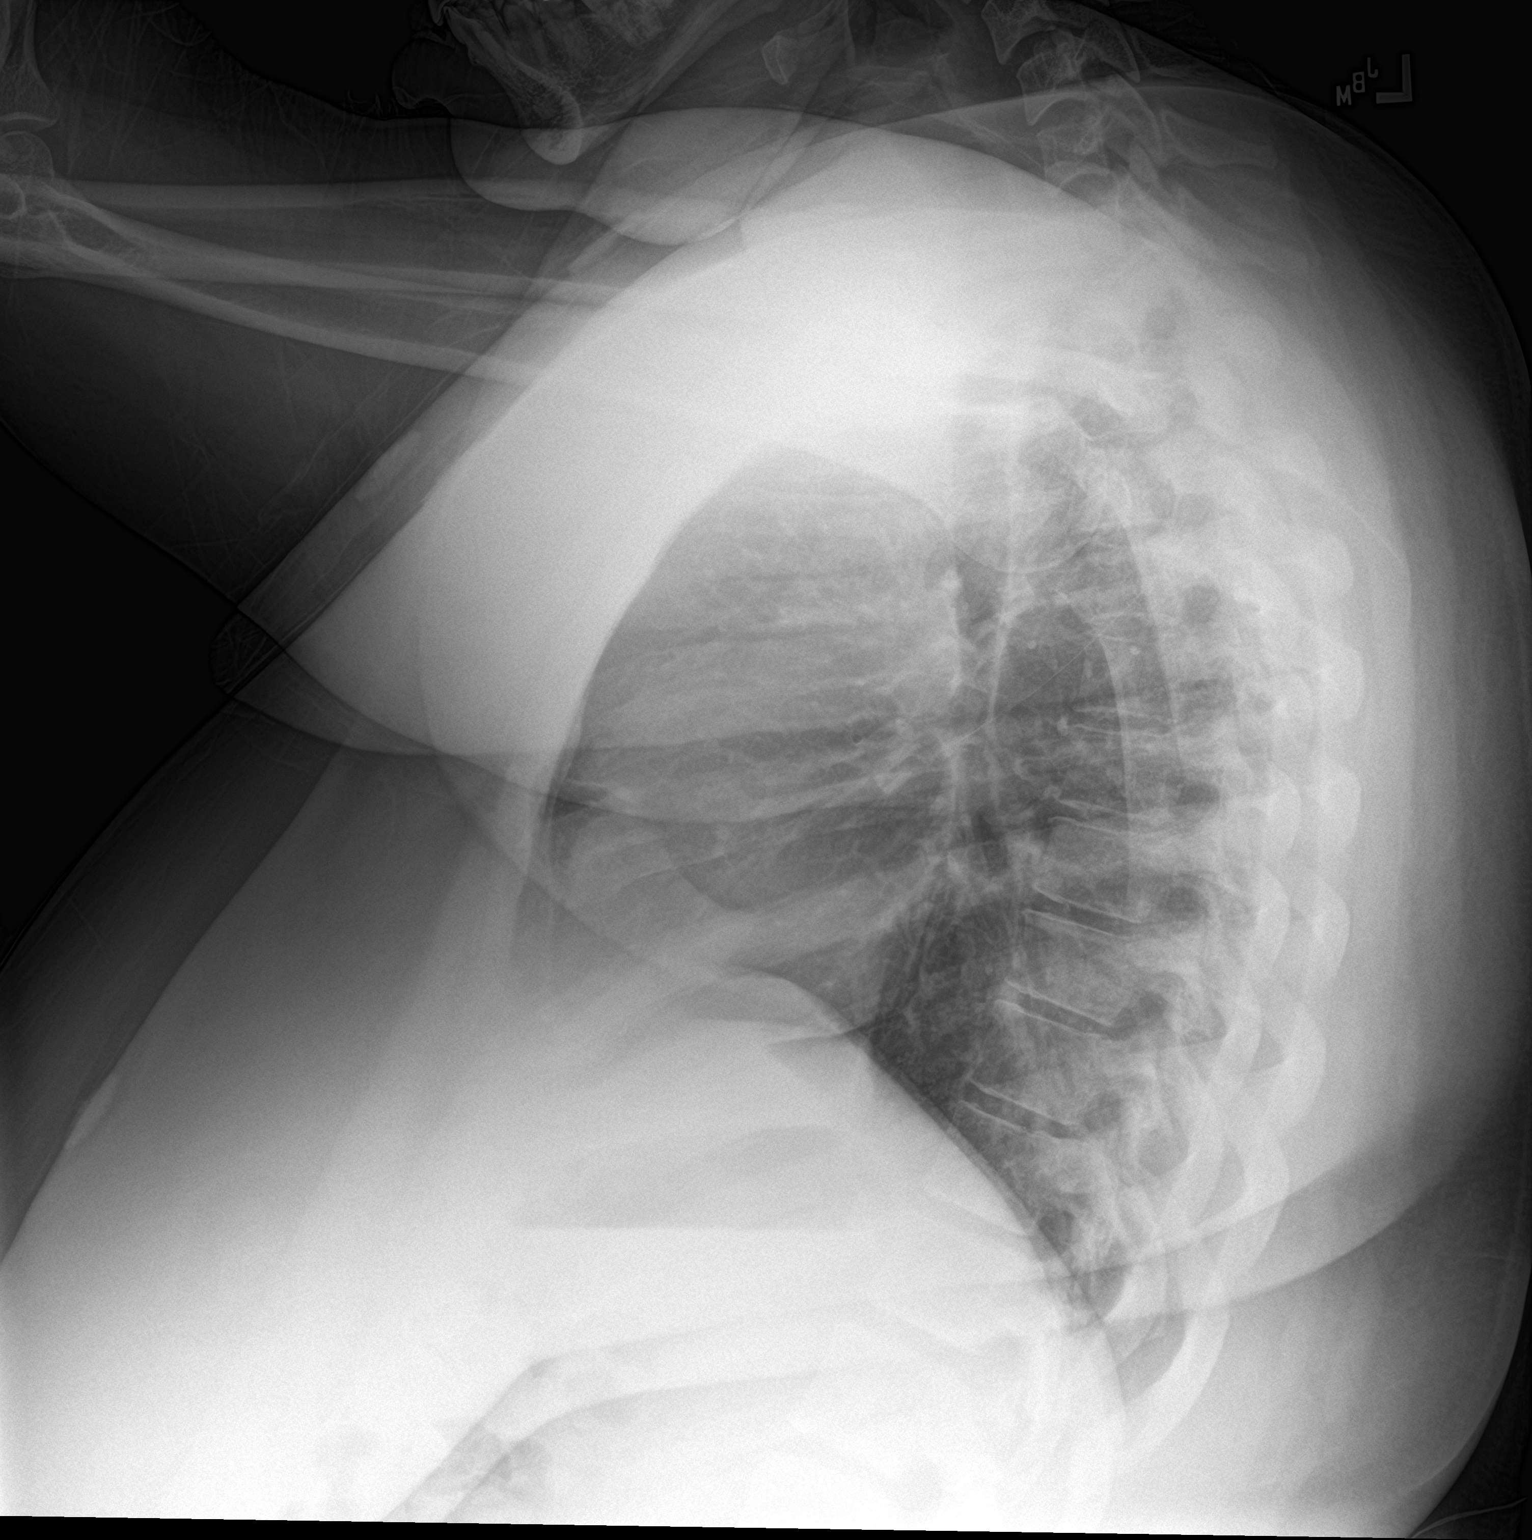

[2 of 2 positions shown; findings below may reference images not displayed]

FINDINGS: Cardiomediastinal silhouette is unchanged and unremarkable.

Mild peribronchial thickening again noted.

There is no evidence of focal airspace disease, pulmonary edema,
suspicious pulmonary nodule/mass, pleural effusion, or pneumothorax.

No acute bony abnormalities are identified.
IMPRESSION: No active cardiopulmonary disease.

## 2020-11-20 ENCOUNTER — Ambulatory Visit (INDEPENDENT_AMBULATORY_CARE_PROVIDER_SITE_OTHER): Payer: Self-pay | Admitting: Primary Care

## 2020-11-20 ENCOUNTER — Encounter (INDEPENDENT_AMBULATORY_CARE_PROVIDER_SITE_OTHER): Payer: Self-pay | Admitting: Primary Care

## 2020-11-20 ENCOUNTER — Other Ambulatory Visit: Payer: Self-pay

## 2020-11-20 VITALS — BP 131/91 | HR 91 | Temp 97.5°F | Ht 66.0 in | Wt 289.2 lb

## 2020-11-20 DIAGNOSIS — Z7689 Persons encountering health services in other specified circumstances: Secondary | ICD-10-CM

## 2020-11-20 DIAGNOSIS — Z23 Encounter for immunization: Secondary | ICD-10-CM

## 2020-11-20 DIAGNOSIS — Z131 Encounter for screening for diabetes mellitus: Secondary | ICD-10-CM

## 2020-11-20 DIAGNOSIS — N926 Irregular menstruation, unspecified: Secondary | ICD-10-CM

## 2020-11-20 LAB — POCT GLYCOSYLATED HEMOGLOBIN (HGB A1C): Hemoglobin A1C: 5.5 % (ref 4.0–5.6)

## 2020-11-20 NOTE — Progress Notes (Signed)
New Patient Office Visit  Subjective:  Patient ID: Sheila Alvarez, female    DOB: [birth year]  Age: 29 y.o. MRN: [id number]  CC:  Chief Complaint  Patient presents with  . New Patient (Initial Visit)    wrist pain    HPI Sheila Alvarez is a 29 year old morbid obese female who presents for establishment of care, wrist pain that is Ace wrap.  Blood pressure slightly elevated systolic 803 and diastolic 91.  She has not been to primary care office for several years due to uninsured.  She now has Medicaid family-planning explaining what that covered.  Advised her to apply for [facility] financial assistance. Denies shortness of breath, headaches, chest pain or lower extremity edema  Past Medical History:  Diagnosis Date  . Asthma   . Diabetes mellitus without complication [facility])     Past Surgical History:  Procedure Laterality Date  . ADENOIDECTOMY    . BACK SURGERY    . TONSILLECTOMY      Family History  Problem Relation Age of Onset  . Diabetes Mother   . Hypertension Mother   . Cancer Mother     Social History   Socioeconomic History  . Marital status: Single    Spouse name: Not on file  . Number of children: Not on file  . Years of education: Not on file  . Highest education level: Not on file  Occupational History  . Not on file  Tobacco Use  . Smoking status: Never Smoker  . Smokeless tobacco: Never Used  Substance and Sexual Activity  . Alcohol use: Yes    Comment: ocassionally - vodka  . Drug use: No  . Sexual activity: Never    Birth control/protection: None  Other Topics Concern  . Not on file  Social History Narrative  . Not on file   Social Determinants of Health   Financial Resource Strain:   . Difficulty of Paying Living Expenses: Not on file  Food Insecurity:   . Worried About Charity fundraiser in the Last Year: Not on file  . Ran Out of Food in the Last Year: Not on file  Transportation Needs:   . Lack of Transportation (Medical): Not  on file  . Lack of Transportation (Non-Medical): Not on file  Physical Activity:   . Days of Exercise per Week: Not on file  . Minutes of Exercise per Session: Not on file  Stress:   . Feeling of Stress : Not on file  Social Connections:   . Frequency of Communication with Friends and Family: Not on file  . Frequency of Social Gatherings with Friends and Family: Not on file  . Attends Religious Services: Not on file  . Active Member of Clubs or Organizations: Not on file  . Attends Archivist Meetings: Not on file  . Marital Status: Not on file  Intimate Partner Violence:   . Fear of Current or Ex-Partner: Not on file  . Emotionally Abused: Not on file  . Physically Abused: Not on file  . Sexually Abused: Not on file    ROS Review of Systems  Musculoskeletal:       Right wrist pain unable to identify how she hurt herself  All other systems reviewed and are negative.   Objective:   Today's Vitals: BP (!) 131/91 (BP Location: Right Arm, Patient Position: Sitting, Cuff Size: Large)   Pulse 91   Temp (!) 97.5 F (36.4 C) (Temporal)   Ht  5' 6" (1.676 m)   Wt 289 lb 3.2 oz (131.2 kg)   LMP 11/12/2020 (Approximate)   SpO2 94%   BMI 46.68 kg/m   Physical Exam Vitals reviewed.  Constitutional:      Appearance: She is obese.  HENT:     Head: Normocephalic.     Right Ear: Tympanic membrane normal.     Left Ear: Tympanic membrane normal.     Nose: Nose normal.  Cardiovascular:     Rate and Rhythm: Normal rate and regular rhythm.  Pulmonary:     Effort: Pulmonary effort is normal.     Breath sounds: Normal breath sounds.  Abdominal:     General: Bowel sounds are normal. There is distension.     Palpations: Abdomen is soft.  Musculoskeletal:     Cervical back: Normal range of motion and neck supple.     Comments: ROM decrease pain from moving right wrist   Neurological:     Mental Status: She is alert and oriented to person, place, and time.  Psychiatric:         Mood and Affect: Mood normal.        Behavior: Behavior normal.        Thought Content: Thought content normal.        Judgment: Judgment normal.     Assessment & Plan:  Sanye was seen today for new patient (initial visit).  Diagnoses and all orders for this visit:  Screening for diabetes mellitus -     HgB A1c 5.5 per ADA guideline is not a dx for diabetes  -     Need for Tdap vaccination  Tdap vaccine greater than or equal to 7yo IM  Irregular menstrual cycle -     Cancel: CBC with Differential -     CBC with Differential; Future  Encounter to establish care Establish care with new provider  -     Cancel: CMP14+EGFR -     CMP14+EGFR; Future  Morbid obesity ([employer]) Morbid Obesity is Body mass index is 46.68 kg/m. 40  indicating an excess in caloric intake or underlining conditions. This may lead to other co-morbidities.HTN, DM and respiratory complications Lifestyle modifications of diet and exercise may reduce obesity.  -     Cancel: Lipid Panel -     Cancel: TSH + free T4 -     TSH + free T4; Future -     Lipid Panel; Future  Outpatient Encounter Medications as of 11/20/2020  Medication Sig  . [DISCONTINUED] cyclobenzaprine (FLEXERIL) 10 MG tablet Take 1 tablet (10 mg total) by mouth 2 (two) times daily as needed for muscle spasms.  . [DISCONTINUED] naproxen (NAPROSYN) 500 MG tablet Take 1 tablet (500 mg total) by mouth 2 (two) times daily.   No facility-administered encounter medications on file as of 11/20/2020.    Follow-up: Return in about 6 months (around 05/20/2021) for Bp f/u and weight .   Kerin Perna, NP

## 2020-11-20 NOTE — Patient Instructions (Addendum)
Hypertension, Adult Hypertension is another name for high blood pressure. High blood pressure forces your heart to work harder to pump blood. This can cause problems over time. There are two numbers in a blood pressure reading. There is a top number (systolic) over a bottom number (diastolic). It is best to have a blood pressure that is below 120/80. Healthy choices can help lower your blood pressure, or you may need medicine to help lower it. What are the causes? The cause of this condition is not known. Some conditions may be related to high blood pressure. What increases the risk?  Smoking.  Having type 2 diabetes mellitus, high cholesterol, or both.  Not getting enough exercise or physical activity.  Being overweight.  Having too much fat, sugar, calories, or salt (sodium) in your diet.  Drinking too much alcohol.  Having long-term (chronic) kidney disease.  Having a family history of high blood pressure.  Age. Risk increases with age.  Race. You may be at higher risk if you are African American.  Gender. Men are at higher risk than women before age 45. After age 65, women are at higher risk than men.  Having obstructive sleep apnea.  Stress. What are the signs or symptoms?  High blood pressure may not cause symptoms. Very high blood pressure (hypertensive crisis) may cause: ? Headache. ? Feelings of worry or nervousness (anxiety). ? Shortness of breath. ? Nosebleed. ? A feeling of being sick to your stomach (nausea). ? Throwing up (vomiting). ? Changes in how you see. ? Very bad chest pain. ? Seizures. How is this treated?  This condition is treated by making healthy lifestyle changes, such as: ? Eating healthy foods. ? Exercising more. ? Drinking less alcohol.  Your health care provider may prescribe medicine if lifestyle changes are not enough to get your blood pressure under control, and if: ? Your top number is above 130. ? Your bottom number is above  80.  Your personal target blood pressure may vary. Follow these instructions at home: Eating and drinking   If told, follow the DASH eating plan. To follow this plan: ? Fill one half of your plate at each meal with fruits and vegetables. ? Fill one fourth of your plate at each meal with whole grains. Whole grains include whole-wheat pasta, brown rice, and whole-grain bread. ? Eat or drink low-fat dairy products, such as skim milk or low-fat yogurt. ? Fill one fourth of your plate at each meal with low-fat (lean) proteins. Low-fat proteins include fish, chicken without skin, eggs, beans, and tofu. ? Avoid fatty meat, cured and processed meat, or chicken with skin. ? Avoid pre-made or processed food.  Eat less than 1,500 mg of salt each day.  Do not drink alcohol if: ? Your doctor tells you not to drink. ? You are pregnant, may be pregnant, or are planning to become pregnant.  If you drink alcohol: ? Limit how much you use to:  0-1 drink a day for women.  0-2 drinks a day for men. ? Be aware of how much alcohol is in your drink. In the [place] one drink equals one 12 oz bottle of beer (355 mL), one 5 oz glass of wine (148 mL), or one 1 oz glass of hard liquor (44 mL). Lifestyle   Work with your doctor to stay at a healthy weight or to lose weight. Ask your doctor what the best weight is for you.  Get at least 30 minutes of exercise most   days of the week. This may include walking, swimming, or biking.  Get at least 30 minutes of exercise that strengthens your muscles (resistance exercise) at least 3 days a week. This may include lifting weights or doing Pilates.  Do not use any products that contain nicotine or tobacco, such as cigarettes, e-cigarettes, and chewing tobacco. If you need help quitting, ask your doctor.  Check your blood pressure at home as told by your doctor.  Keep all follow-up visits as told by your doctor. This is important. Medicines  Take over-the-counter  and prescription medicines only as told by your doctor. Follow directions carefully.  Do not skip doses of blood pressure medicine. The medicine does not work as well if you skip doses. Skipping doses also puts you at risk for problems.  Ask your doctor about side effects or reactions to medicines that you should watch for. Contact a doctor if you:  Think you are having a reaction to the medicine you are taking.  Have headaches that keep coming back (recurring).  Feel dizzy.  Have swelling in your ankles.  Have trouble with your vision.  Wrist Pain, Adult There are many things that can cause wrist pain. Some common causes include:  An injury to the wrist area.  Overuse of the joint.  A condition that causes too much pressure to be put on a nerve in the wrist (carpal tunnel syndrome).  Wear and tear of the joints that happens as a person gets older (osteoarthritis).  Other types of arthritis. Sometimes, the cause of wrist pain is not known. Often, the pain goes away when you follow your doctor's instructions for helping pain at home, such as resting or icing your wrist. If your wrist pain does not go away, it is important to tell your doctor. Follow these instructions at home:  Rest the wrist area for 48 hours or more, or as long as told by your doctor.  If a splint or elastic bandage has been put on your wrist, use it as told by your doctor. ? Take off the splint or bandage only as told by your doctor. ? Loosen the splint or bandage if your fingers tingle, lose feeling (get numb), or turn cold or blue.  If directed, apply ice to the injured area: ? If you have a removable splint or elastic bandage, remove it as told by your doctor. ? Put ice in a plastic bag. ? Place a towel between your skin and the bag or between your splint or bandage and the bag. ? Leave the ice on for 20 minutes, 2-3 times a day.   Keep your arm raised (elevated) above the level of your heart while  you are sitting or lying down.  Take over-the-counter and prescription medicines only as told by your doctor.  Keep all follow-up visits as told by your doctor. This is important. Contact a doctor if:  You have a sudden sharp pain in the wrist, hand, or arm that is different or new.  The swelling or bruising on your wrist or hand gets worse.  Your skin becomes red, gets a rash, or has open sores.  Your pain does not get better or it gets worse. Get help right away if:  You lose feeling in your fingers or hand.  Your fingers turn white, very red, or cold and blue.  You cannot move your fingers.  You have a fever or chills. This information is not intended to replace advice given to you  by your health care provider. Make sure you discuss any questions you have with your health care provider. Document Revised: 11/27/2017 Document Reviewed: 07/03/2016 Elsevier Patient Education  2020 [employer]. Get help right away if you:  Get a very bad headache.  Start to feel mixed up (confused).  Feel weak or numb.  Feel faint.  Have very bad pain in your: ? Chest. ? Belly (abdomen).  Throw up more than once.  Have trouble breathing. Summary  Hypertension is another name for high blood pressure.  High blood pressure forces your heart to work harder to pump blood.  For most people, a normal blood pressure is less than 120/80.  Making healthy choices can help lower blood pressure. If your blood pressure does not get lower with healthy choices, you may need to take medicine. This information is not intended to replace advice given to you by your health care provider. Make sure you discuss any questions you have with your health care provider. Document Revised: 08/25/2018 Document Reviewed: 08/25/2018 Elsevier Patient Education  2020 [employer].

## 2021-01-09 ENCOUNTER — Ambulatory Visit (INDEPENDENT_AMBULATORY_CARE_PROVIDER_SITE_OTHER): Payer: Self-pay | Admitting: *Deleted

## 2021-01-09 NOTE — Telephone Encounter (Signed)
Patient is calling to report she has cough that causes her to be SOB afterward.(Cough is dry- not breaking up congestion) Patient also states she has coughed so much- she is sore. Patient has had symptoms for 2 days- advised patient she does need appointment but office is closed for lunch- I am unable to schedule appointment for today-discussed virtual visit options or UC- patient states she does not have access to do virtual visit option. Advised would send note for office review to see if they can get appointment for her today- would still need to be virtual/PC- or they may recommend that she go to UC.  Reason for Disposition . [1] HIGH RISK patient AND [2] influenza is widespread in the community AND [3] ONE OR MORE respiratory symptoms: cough, sore throat, runny or stuffy nose  Answer Assessment - Initial Assessment Questions 1. COVID-19 DIAGNOSIS: "Who made your COVID-19 diagnosis?" "Was it confirmed by a positive lab test?" If not diagnosed by a HCP, ask "Are there lots of cases (community spread) where you live?" Note: See public health department website, if unsure.     Symptoms only 2. COVID-19 EXPOSURE: "Was there any known exposure to COVID before the symptoms began?" CDC Definition of close contact: within 6 feet (2 meters) for a total of 15 minutes or more over a 24-hour period.      No known exposure 3. ONSET: "When did the COVID-19 symptoms start?"      2 days ago 4. WORST SYMPTOM: "What is your worst symptom?" (e.g., cough, fever, shortness of breath, muscle aches)     SOB 5. COUGH: "Do you have a cough?" If Yes, ask: "How bad is the cough?"       Yes- spells, worse at night 6. FEVER: "Do you have a fever?" If Yes, ask: "What is your temperature, how was it measured, and when did it start?"     No fever 7. RESPIRATORY STATUS: "Describe your breathing?" (e.g., shortness of breath, wheezing, unable to speak)      SOB after coughing spell 8. BETTER-SAME-WORSE: "Are you getting  better, staying the same or getting worse compared to yesterday?"  If getting worse, ask, "In what way?"     Same- not better 9. HIGH RISK DISEASE: "Do you have any chronic medical problems?" (e.g., asthma, heart or lung disease, weak immune system, obesity, etc.)     no 10. VACCINE: "Have you gotten the COVID-19 vaccine?" If Yes ask: "Which one, how many shots, when did you get it?"       no 11. PREGNANCY: "Is there any chance you are pregnant?" "When was your last menstrual period?"       No- LMP-last week 12. OTHER SYMPTOMS: "Do you have any other symptoms?"  (e.g., chills, fatigue, headache, loss of smell or taste, muscle pain, sore throat; new loss of smell or taste especially support the diagnosis of COVID-19)       Chest is sore from coughing  Protocols used: CORONAVIRUS (COVID-19) DIAGNOSED OR SUSPECTED-A-AH

## 2021-01-10 NOTE — Telephone Encounter (Signed)
Agree with nurse. Difficulty breathing can be life threatening. When no appointments available with PCP take care of your self Seek urgent care

## 2021-04-11 ENCOUNTER — Ambulatory Visit: Payer: Self-pay

## 2021-04-11 NOTE — Telephone Encounter (Signed)
Pt. Reports she fainted 2 hours ago. Was at home trying to have a bowel movement. Woke up slumped over. States she now has a headache. Unsure if she hit her head. No availability in the practice. Will someone take her to UC.  Reason for Disposition . [1] All other patients AND [2] now alert and feels fine(Exception: SIMPLE FAINT due to stress, pain, prolonged standing, or suddenly standing)  Answer Assessment - Initial Assessment Questions 1. ONSET: "How long were you unconscious?" (minutes) "When did it happen?"     2 hours ago 2. CONTENT: "What happened during period of unconsciousness?" (e.g., seizure activity)      Unsure 3. MENTAL STATUS: "Alert and oriented now?" (oriented x 3 = name, month, location)      Alert 4. TRIGGER: "What do you think caused the fainting?" "What were you doing just before you fainted?"  (e.g., exercise, sudden standing up, prolonged standing)     Sitting on commode 5. RECURRENT SYMPTOM: "Have you ever passed out before?" If Yes, ask: "When was the last time?" and "What happened that time?"      No 6. INJURY: "Did you sustain any injury during the fall?"      No 7. CARDIAC SYMPTOMS: "Have you had any of the following symptoms: chest pain, difficulty breathing, palpitations?"     No 8. NEUROLOGIC SYMPTOMS: "Have you had any of the following symptoms: headache, numbness, vertigo, weakness?"     Headache 9. GI SYMPTOMS: "Have you had any of the following symptoms: abdominal pain, vomiting, diarrhea, blood in stools?"     No 10. OTHER SYMPTOMS: "Do you have any other symptoms?"       No 11. PREGNANCY: "Is there any chance you are pregnant?" "When was your last menstrual period?"       No  Protocols used: [facility]

## 2021-05-16 ENCOUNTER — Telehealth (INDEPENDENT_AMBULATORY_CARE_PROVIDER_SITE_OTHER): Payer: Self-pay | Admitting: Primary Care

## 2021-05-16 NOTE — Telephone Encounter (Signed)
Called patient and LVM advising her I was calling from [facility] in regards to her upcoming appointment on Monday 05/20/21. Unfortunately her provider will be out of the office and this appointment has been cancelled and needs to be rescheduled. Advised patient to call [phone] to reschedule.

## 2021-05-20 ENCOUNTER — Ambulatory Visit (INDEPENDENT_AMBULATORY_CARE_PROVIDER_SITE_OTHER): Payer: Medicaid Other | Admitting: Family Medicine

## 2021-06-10 ENCOUNTER — Ambulatory Visit (INDEPENDENT_AMBULATORY_CARE_PROVIDER_SITE_OTHER): Payer: Medicaid Other | Admitting: Primary Care

## 2021-06-26 ENCOUNTER — Encounter (INDEPENDENT_AMBULATORY_CARE_PROVIDER_SITE_OTHER): Payer: Self-pay | Admitting: Primary Care

## 2021-06-26 ENCOUNTER — Other Ambulatory Visit: Payer: Self-pay

## 2021-06-26 ENCOUNTER — Ambulatory Visit (INDEPENDENT_AMBULATORY_CARE_PROVIDER_SITE_OTHER): Payer: Self-pay | Admitting: Primary Care

## 2021-06-26 VITALS — BP 131/88 | HR 98 | Temp 97.5°F | Resp 16 | Wt 302.0 lb

## 2021-06-26 DIAGNOSIS — Z131 Encounter for screening for diabetes mellitus: Secondary | ICD-10-CM

## 2021-06-26 DIAGNOSIS — H539 Unspecified visual disturbance: Secondary | ICD-10-CM

## 2021-06-26 DIAGNOSIS — R030 Elevated blood-pressure reading, without diagnosis of hypertension: Secondary | ICD-10-CM

## 2021-06-26 NOTE — Progress Notes (Signed)
Vision changes  Onset 3 mos ago.

## 2021-06-26 NOTE — Patient Instructions (Signed)

## 2021-06-26 NOTE — Progress Notes (Signed)
Acute Office Visit  Subjective:    Patient ID: Sheila Alvarez, female    DOB: January 25, 1991, 30 y.o.   MRN: [id number]  Chief Complaint  Patient presents with   Blurred Vision    HPI Sheila Alvarez is a 30 year old morbid obese female in today for vision changes which has become worse in the last 3 months.  She endorses increase in polyuria, polyphagia, polydipsia and vision changes.  Previous A1c 2021 will repeat today.  She does have a family history of diabetes her mother.  Past Medical History:  Diagnosis Date   Asthma    Diabetes mellitus without complication (HCC)     Past Surgical History:  Procedure Laterality Date   ADENOIDECTOMY     BACK SURGERY     TONSILLECTOMY      Family History  Problem Relation Age of Onset   Diabetes Mother    Hypertension Mother    Cancer Mother     Social History   Socioeconomic History   Marital status: Single    Spouse name: Not on file   Number of children: Not on file   Years of education: Not on file   Highest education level: Not on file  Occupational History   Not on file  Tobacco Use   Smoking status: Never   Smokeless tobacco: Never  Substance and Sexual Activity   Alcohol use: Yes    Comment: ocassionally - vodka   Drug use: No   Sexual activity: Never    Birth control/protection: None  Other Topics Concern   Not on file  Social History Narrative   Not on file   Social Determinants of Health   Financial Resource Strain: Not on file  Food Insecurity: Not on file  Transportation Needs: Not on file  Physical Activity: Not on file  Stress: Not on file  Social Connections: Not on file  Intimate Partner Violence: Not on file    No outpatient medications prior to visit.   No facility-administered medications prior to visit.    No Known Allergies  Review of Systems  Constitutional:  Positive for unexpected weight change.  Eyes:  Positive for visual disturbance.  Endocrine: Positive for  polydipsia, polyphagia and polyuria.  All other systems reviewed and are negative.     Objective:    Physical Exam Vitals reviewed.  Constitutional:      Appearance: She is obese.     Comments: morbid  HENT:     Head: Normocephalic.     Right Ear: Tympanic membrane and external ear normal.     Left Ear: Tympanic membrane and external ear normal.     Nose: Nose normal.  Eyes:     Extraocular Movements: Extraocular movements intact.     Pupils: Pupils are equal, round, and reactive to light.  Cardiovascular:     Rate and Rhythm: Normal rate and regular rhythm.  Pulmonary:     Effort: Pulmonary effort is normal.     Breath sounds: Normal breath sounds.  Abdominal:     General: Abdomen is flat. There is distension.     Palpations: Abdomen is soft.  Musculoskeletal:        General: Normal range of motion.     Cervical back: Normal range of motion and neck supple.  Skin:    General: Skin is warm and dry.  Neurological:     Mental Status: She is oriented to person, place, and time.  Psychiatric:  Mood and Affect: Mood normal.        Behavior: Behavior normal.        Thought Content: Thought content normal.        Judgment: Judgment normal.   BP 131/88   Pulse 98   Temp (!) 97.5 F (36.4 C)   Resp 16   Wt (!) 302 lb (137 kg)   LMP 06/18/2021 (Exact Date)   SpO2 96%   BMI 48.74 kg/m  Wt Readings from Last 3 Encounters:  06/26/21 (!) 302 lb (137 kg)  11/20/20 289 lb 3.2 oz (131.2 kg)  01/27/18 279 lb (126.6 kg)    Health Maintenance Due  Topic Date Due   COVID-19 Vaccine (1) Never done   HIV Screening  Never done   Hepatitis C Screening  Never done   PAP-Cervical Cytology Screening  Never done   PAP SMEAR-Modifier  Never done    There are no preventive care reminders to display for this patient.   Lab Results  Component Value Date   TSH 2.730 04/06/2017   Lab Results  Component Value Date   WBC 6.1 02/21/2018   HGB 13.5 02/21/2018   HCT 40.6  02/21/2018   MCV 72.5 (L) 02/21/2018   PLT 324 02/21/2018   Lab Results  Component Value Date   NA 137 02/21/2018   K 3.7 02/21/2018   CO2 23 02/21/2018   GLUCOSE 74 02/21/2018   BUN 8 02/21/2018   CREATININE 0.76 02/21/2018   BILITOT 0.7 01/01/2018   ALKPHOS 65 01/01/2018   AST 24 01/01/2018   ALT 18 01/01/2018   PROT 7.3 01/01/2018   ALBUMIN 4.1 01/01/2018   CALCIUM 9.1 02/21/2018   ANIONGAP 11 02/21/2018   No results found for: CHOL No results found for: HDL No results found for: LDLCALC No results found for: TRIG No results found for: CHOLHDL Lab Results  Component Value Date   HGBA1C 5.5 11/20/2020       Assessment & Plan:  Bowen was seen today for blurred vision.  Diagnoses and all orders for this visit:  Screening for diabetes mellitus A1C 5.7   Morbid obesity (HCC) Morbid Obesity is > 40 indicating an excess in caloric intake or underlining conditions. This may lead to other co-morbidities. Lifestyle modifications of diet and exercise may reduce obesity.   Referrals made exercise and nutritionist   Vision changes R/o diabetes last eye exam when she was a change encourage to get eye exam - most reasonable    Borderline blood pressure   Grayce Sessions, NP

## 2021-09-30 ENCOUNTER — Ambulatory Visit (INDEPENDENT_AMBULATORY_CARE_PROVIDER_SITE_OTHER): Payer: Self-pay | Admitting: *Deleted

## 2021-09-30 NOTE — Telephone Encounter (Signed)
Pt reports BP this am 161/117, checked by pharmacist at workplace. Reports headache, constant 7/10, and blurred vision "Can't focus." No h/o hypertension, no meds. Advised ED. Pt states will follow disposition, has driver.      Reason for Disposition  [1] Systolic BP  >= 160 OR Diastolic >= 100 AND [2] cardiac or neurologic symptoms (e.g., chest pain, difficulty breathing, unsteady gait, blurred vision)  Answer Assessment - Initial Assessment Questions 1. BLOOD PRESSURE: "What is the blood pressure?" "Did you take at least two measurments 5 minutes apart?"     161/117 2. ONSET: "When did you take your blood pressure?161/117     This AM 3. HOW: "How did you obtain the blood pressure?" (e.g., visiting nurse, automatic home BP monitor)     At pharmacy 4. HISTORY: "Do you have a history of high blood pressure?"     no 5. MEDICATIONS: "Are you taking any medications for blood pressure?" "Have you missed any doses recently?"     no 6. OTHER SYMPTOMS: "Do you have any symptoms?" (e.g., headache, chest pain, blurred vision, difficulty breathing, weakness)     Blurred vision, headache, constant, 7/10 7. PREGNANCY: "Is there any chance you are pregnant?" "When was your last menstrual period?"     no  Protocols used: Blood Pressure - High-A-AH

## 2021-09-30 NOTE — Telephone Encounter (Signed)
FYI

## 2022-01-10 ENCOUNTER — Encounter (HOSPITAL_BASED_OUTPATIENT_CLINIC_OR_DEPARTMENT_OTHER): Payer: Self-pay | Admitting: Emergency Medicine

## 2022-01-10 ENCOUNTER — Emergency Department (HOSPITAL_BASED_OUTPATIENT_CLINIC_OR_DEPARTMENT_OTHER)
Admission: EM | Admit: 2022-01-10 | Discharge: 2022-01-10 | Disposition: A | Discharge status: Home / Self Care | Payer: Medicaid Other | Attending: Emergency Medicine | Admitting: Emergency Medicine

## 2022-01-10 ENCOUNTER — Other Ambulatory Visit: Payer: Self-pay

## 2022-01-10 DIAGNOSIS — J101 Influenza due to other identified influenza virus with other respiratory manifestations: Secondary | ICD-10-CM

## 2022-01-10 DIAGNOSIS — R5383 Other fatigue: Secondary | ICD-10-CM | POA: Diagnosis not present

## 2022-01-10 DIAGNOSIS — Z20822 Contact with and (suspected) exposure to covid-19: Secondary | ICD-10-CM | POA: Insufficient documentation

## 2022-01-10 DIAGNOSIS — R000 Tachycardia, unspecified: Secondary | ICD-10-CM | POA: Insufficient documentation

## 2022-01-10 DIAGNOSIS — R079 Chest pain, unspecified: Secondary | ICD-10-CM | POA: Diagnosis not present

## 2022-01-10 DIAGNOSIS — R059 Cough, unspecified: Secondary | ICD-10-CM | POA: Diagnosis not present

## 2022-01-10 DIAGNOSIS — J4520 Mild intermittent asthma, uncomplicated: Secondary | ICD-10-CM

## 2022-01-10 DIAGNOSIS — R0789 Other chest pain: Secondary | ICD-10-CM | POA: Diagnosis not present

## 2022-01-10 LAB — RESP PANEL BY RT-PCR (FLU A&B, COVID) ARPGX2
Influenza A by PCR: POSITIVE — AB
Influenza B by PCR: NEGATIVE
SARS Coronavirus 2 by RT PCR: NEGATIVE

## 2022-01-10 MED ORDER — IBUPROFEN 800 MG PO TABS
800.0000 mg | ORAL_TABLET | Freq: Once | ORAL | Status: AC
  Administered 2022-01-10: 800 mg via ORAL
  Filled 2022-01-10: qty 1

## 2022-01-10 MED ORDER — BENZONATATE 100 MG PO CAPS
100.0000 mg | ORAL_CAPSULE | Freq: Once | ORAL | Status: AC
Start: 2022-01-10 — End: 2022-01-10
  Administered 2022-01-10: 100 mg via ORAL
  Filled 2022-01-10: qty 1

## 2022-01-10 MED ORDER — ACETAMINOPHEN 325 MG PO TABS
650.0000 mg | ORAL_TABLET | Freq: Once | ORAL | Status: AC | PRN
  Administered 2022-01-10: 650 mg via ORAL
  Filled 2022-01-10: qty 2

## 2022-01-10 MED ORDER — ALBUTEROL SULFATE HFA 108 (90 BASE) MCG/ACT IN AERS
1.0000 | INHALATION_SPRAY | Freq: Once | RESPIRATORY_TRACT | Status: AC
  Administered 2022-01-10: 1 via RESPIRATORY_TRACT
  Filled 2022-01-10: qty 6.7

## 2022-01-10 MED ORDER — BENZONATATE 100 MG PO CAPS: 100.0000 mg | ORAL_CAPSULE | Freq: Three times a day (TID) | ORAL | 0 refills | Status: DC

## 2022-01-10 MED ORDER — DEXAMETHASONE SODIUM PHOSPHATE 10 MG/ML IJ SOLN
10.0000 mg | Freq: Once | INTRAMUSCULAR | Status: AC
Start: 2022-01-10 — End: 2022-01-10
  Administered 2022-01-10: 10 mg via INTRAMUSCULAR
  Filled 2022-01-10: qty 1

## 2022-01-10 NOTE — ED Provider Notes (Signed)
[facility] EMERGENCY DEPT Provider Note   CSN: [id number] Arrival date & time: 01/10/22  1031     History  Chief Complaint  Patient presents with   Generalized Body Aches    Sheila Alvarez is a 31 y.o. female.  HPI  Patient is a 31 year old female with past medical history of type 2 diabetes and asthma presenting with generalized body aches x1 day.  Her symptoms started acutely yesterday after working at [employer], she started having fevers and diffuse body aches.  She also has a dry cough which has been intermittent.  She has tried taking over-the-counter Robitussin which has not alleviated her symptoms.  She reports she did feel short of breath after coughing but does not currently have her rescue inhaler because she lost it.  No chest pain.    Home Medications Prior to Admission medications   Medication Sig Start Date End Date Taking? Authorizing Provider  benzonatate (TESSALON) 100 MG capsule Take 1 capsule (100 mg total) by mouth every 8 (eight) hours. 01/10/22  Yes Theron Arista, PA-C      Allergies    Patient has no known allergies.    Review of Systems   Review of Systems  Constitutional:  Positive for fever.  Respiratory:  Positive for shortness of breath and wheezing.   Cardiovascular:  Negative for chest pain.  Musculoskeletal:  Positive for myalgias.  Neurological:  Negative for syncope.   Physical Exam Updated Vital Signs BP (!) 141/70    Pulse (!) 113    Temp 100.1 F (37.8 C)    Resp 20    LMP 01/03/2022 (Approximate)    SpO2 97%  Physical Exam Vitals and nursing note reviewed. Exam conducted with a chaperone present.  Constitutional:      Appearance: Normal appearance.  HENT:     Head: Normocephalic.     Nose: Congestion present.     Mouth/Throat:     Pharynx: Posterior oropharyngeal erythema present.  Eyes:     Extraocular Movements: Extraocular movements intact.     Pupils: Pupils are equal, round, and reactive to light.   Cardiovascular:     Rate and Rhythm: Regular rhythm. Tachycardia present.     Comments: Patient is tachycardic, regular rhythm. Pulmonary:     Effort: Pulmonary effort is normal.     Breath sounds: Wheezing present.     Comments: Lungs CTA bilaterally. No accessory muscle use. Speaking in complete sentences.  Slight expiratory wheeze with forced expiration to the left lower lobe. Abdominal:     General: Abdomen is flat.     Palpations: Abdomen is soft.  Musculoskeletal:     Cervical back: Normal range of motion.  Neurological:     Mental Status: She is alert.  Psychiatric:        Mood and Affect: Mood normal.    ED Results / Procedures / Treatments   Labs (all labs ordered are listed, but only abnormal results are displayed) Labs Reviewed  RESP PANEL BY RT-PCR (FLU A&B, COVID) ARPGX2 - Abnormal; Notable for the following components:      Result Value   Influenza A by PCR POSITIVE (*)    All other components within normal limits    EKG None  Radiology No results found.  Procedures Procedures    Medications Ordered in ED Medications  acetaminophen (TYLENOL) tablet 650 mg (650 mg Oral Given 01/10/22 1055)  dexamethasone (DECADRON) injection 10 mg (10 mg Intramuscular Given 01/10/22 1215)  albuterol (VENTOLIN HFA) 108 (  90 Base) MCG/ACT inhaler 1 puff (1 puff Inhalation Given 01/10/22 1214)  benzonatate (TESSALON) capsule 100 mg (100 mg Oral Given 01/10/22 1214)  ibuprofen (ADVIL) tablet 800 mg (800 mg Oral Given 01/10/22 1331)    ED Course/ Medical Decision Making/ A&P                           Medical Decision Making  This is a 31 year old female presenting due to flulike symptoms.  Her medical history is notable for asthma and diabetes, her physical exam does show regular tachycardia which I think is secondary to her fever.  She was given Tylenol prior to my arrival, respiratory panel was also ordered and is notable for influenza a.  Given she is flu positive and  febrile I think her tachycardia is related to the viral illness rather than a cardiac process such as a PE.  Additionally, patient is not having any chest pain.  She is not hypoxic, although she is tachycardic and febrile I do not think she is septic requiring additional work-up here in the ED setting or hospitalization.  Were reviewed by myself personally.  She is flu positive on the respiratory panel.  There is no tachypnea or respiratory distress, also not hypoxic.  Not consistent with an acute asthma exacerbation.  Given the lack of inhaler at home and slight expiratory wheeze with forced expiration will give albuterol here in the ED.  We will also give shot of Decadron, she is diabetic so I consider not doing this but I think the benefit outweighs the risk of hyperglycemia at this time.  We engaged in shared decision-making and the patient was in agreement.  Also given Jerilynn Som, will discharge with supportive care advised.  Return precautions were also discussed and agreed upon.  She was discharged in stable condition.        Final Clinical Impression(s) / ED Diagnoses Final diagnoses:  Influenza A  Mild intermittent asthma without complication    Rx / DC Orders ED Discharge Orders          Ordered    benzonatate (TESSALON) 100 MG capsule  Every 8 hours        01/10/22 1213              Theron Arista, PA-C 01/10/22 1558    Arby Barrette, MD 01/11/22 1332

## 2022-01-10 NOTE — ED Triage Notes (Signed)
Pt c/o cough and body aches since yesterday. Pt took robitussin last night and dayquill this morning with no relief.

## 2022-01-10 NOTE — Discharge Instructions (Addendum)
You have influenza A.  You will need to stay home from work until you are fever free for at least 24 hours, work note is provided below.    While in the ED we refilled your inhaler and gave you a shot of steroids which will stay in your system for 3 days.  Use the inhaler at home as needed for wheezes and shortness of breath.  You can take the [facility] every 8 hours as needed for cough.  Take Tylenol and Motrin for the fever.

## 2023-01-24 ENCOUNTER — Emergency Department (HOSPITAL_BASED_OUTPATIENT_CLINIC_OR_DEPARTMENT_OTHER): Payer: Self-pay | Admitting: Radiology

## 2023-01-24 ENCOUNTER — Emergency Department (HOSPITAL_BASED_OUTPATIENT_CLINIC_OR_DEPARTMENT_OTHER)
Admission: EM | Admit: 2023-01-24 | Discharge: 2023-01-24 | Disposition: A | Discharge status: Home / Self Care | Payer: Self-pay | Attending: Emergency Medicine | Admitting: Emergency Medicine

## 2023-01-24 ENCOUNTER — Encounter (HOSPITAL_BASED_OUTPATIENT_CLINIC_OR_DEPARTMENT_OTHER): Payer: Self-pay | Admitting: Emergency Medicine

## 2023-01-24 ENCOUNTER — Other Ambulatory Visit: Payer: Self-pay

## 2023-01-24 DIAGNOSIS — E119 Type 2 diabetes mellitus without complications: Secondary | ICD-10-CM | POA: Insufficient documentation

## 2023-01-24 DIAGNOSIS — J4521 Mild intermittent asthma with (acute) exacerbation: Secondary | ICD-10-CM | POA: Insufficient documentation

## 2023-01-24 DIAGNOSIS — Z1152 Encounter for screening for COVID-19: Secondary | ICD-10-CM | POA: Insufficient documentation

## 2023-01-24 LAB — RESP PANEL BY RT-PCR (RSV, FLU A&B, COVID)  RVPGX2
Influenza A by PCR: NEGATIVE
Influenza B by PCR: NEGATIVE
Resp Syncytial Virus by PCR: NEGATIVE
SARS Coronavirus 2 by RT PCR: NEGATIVE

## 2023-01-24 MED ORDER — BENZONATATE 100 MG PO CAPS: 100.0000 mg | ORAL_CAPSULE | Freq: Three times a day (TID) | ORAL | 0 refills | Status: DC

## 2023-01-24 MED ORDER — DEXAMETHASONE 4 MG PO TABS
10.0000 mg | ORAL_TABLET | Freq: Every day | ORAL | Status: DC
  Administered 2023-01-24: 10 mg via ORAL
  Filled 2023-01-24: qty 3

## 2023-01-24 MED ORDER — PREDNISONE 20 MG PO TABS: 40.0000 mg | ORAL_TABLET | Freq: Every day | ORAL | 0 refills | Status: AC

## 2023-01-24 MED ORDER — IPRATROPIUM-ALBUTEROL 0.5-2.5 (3) MG/3ML IN SOLN
3.0000 mL | Freq: Once | RESPIRATORY_TRACT | Status: AC
  Administered 2023-01-24: 3 mL via RESPIRATORY_TRACT
  Filled 2023-01-24: qty 3

## 2023-01-24 NOTE — ED Notes (Signed)
Pt performed O2 challenge ambulation. RA pt sats 100%, during ambulation pt sats 96%, post ambulation pt sats 99%. Pt respiratory status remained stable on RA w/mild increased WOB while ambulating which pt states is normal for her.

## 2023-01-24 NOTE — ED Provider Notes (Signed)
[place] Provider Note   CSN: [id number] Arrival date & time: 01/24/23  1652     History Chief Complaint  Patient presents with   Shortness of Breath    Avril LAKEETA DOBOSZ is a 32 y.o. female with history of asthma and diabetes presents the emergency room today for evaluation of runny nose, nasal congestion, and dry cough for the past 2 to 3 days has been worsening.  Patient reports that she has been trying some over-the-counter cold and flu medication and also tried her albuterol inhaler but has not had much relief of symptoms.  She reports that she was not feeling well today so decided to come into the emergency department.  She denies any fever, nausea, vomiting, abdominal pain, or bodyaches.  She reports that she does have some chest pain but only with coughing.  She has been trying some Hall's cough drops as well without much relief.   Shortness of Breath Associated symptoms: cough   Associated symptoms: no abdominal pain, no chest pain, no fever, no sore throat and no vomiting        Home Medications Prior to Admission medications   Medication Sig Start Date End Date Taking? Authorizing Provider  benzonatate (TESSALON) 100 MG capsule Take 1 capsule (100 mg total) by mouth every 8 (eight) hours. 01/10/22   Sherrill Raring, PA-C      Allergies    Patient has no known allergies.    Review of Systems   Review of Systems  Constitutional:  Negative for chills and fever.  HENT:  Positive for congestion and rhinorrhea. Negative for sore throat.   Respiratory:  Positive for cough and shortness of breath.   Cardiovascular:  Negative for chest pain.  Gastrointestinal:  Negative for abdominal pain, nausea and vomiting.  Genitourinary:  Negative for dysuria and hematuria.  Musculoskeletal:  Negative for myalgias.    Physical Exam Updated Vital Signs BP (!) 155/115 (BP Location: Right Arm)   Pulse 100   Temp 98.7 F (37.1 C) (Oral)    Resp (!) 24   LMP 01/21/2023   SpO2 100%  Physical Exam Vitals and nursing note reviewed.  Constitutional:      General: She is not in acute distress.    Appearance: She is not toxic-appearing.  HENT:     Head: Normocephalic and atraumatic.     Right Ear: Tympanic membrane, ear canal and external ear normal.     Left Ear: Tympanic membrane, ear canal and external ear normal.     Nose:     Comments: Bilateral nasal turbinate edema and erythema with scant clear nasal discharge.    Mouth/Throat:     Mouth: Mucous membranes are moist.     Comments: No pharyngeal erythema, exudate, or edema noted.  Uvula midline.  Airway patent.  Moist mucous membranes. Eyes:     General: No scleral icterus.    Conjunctiva/sclera: Conjunctivae normal.  Cardiovascular:     Rate and Rhythm: Normal rate.  Pulmonary:     Effort: Pulmonary effort is normal. No respiratory distress.     Comments: Diminished in the lower bases.  She speaking in full sentences with ease and satting well on room air with minimal increased work of breathing.  No nasal flaring, cyanosis, accessory muscle use, tripoding, or respiratory distress. Abdominal:     General: Abdomen is flat.     Palpations: Abdomen is soft.     Tenderness: There is no abdominal tenderness. There  is no guarding or rebound.  Musculoskeletal:        General: No deformity.     Cervical back: Normal range of motion.  Skin:    Findings: No rash.  Neurological:     General: No focal deficit present.     Mental Status: She is alert.     ED Results / Procedures / Treatments   Labs (all labs ordered are listed, but only abnormal results are displayed) Labs Reviewed  RESP PANEL BY RT-PCR (RSV, FLU A&B, COVID)  RVPGX2    EKG None  Radiology No results found.  Procedures Procedures   Medications Ordered in ED Medications  ipratropium-albuterol (DUONEB) 0.5-2.5 (3) MG/3ML nebulizer solution 3 mL (3 mLs Nebulization Given 01/24/23 1721)    ED  Course/ Medical Decision Making/ A&P                           Medical Decision Making Amount and/or Complexity of Data Reviewed Radiology: ordered.  Risk Prescription drug management.   32 year old female presents emerged department today for evaluation of cough and cold symptoms.  Differential diagnosis includes is limited to viral illness, COVID, flu, RSV, bronchitis, pneumonia, asthma exacerbation.  Vital signs show blood pressure 125/115, afebrile, borderline tachycardic satting well on room air with slight increased work of breathing however no tripoding or accessory muscle use and she speaking in full sentences with ease.  Physical exam as noted above.  Respiratory therapy gave her a breathing treatment before I evaluated her.  On my evaluation, she does have some diminished sounds in the bases.  Per RT, she is responding well to the breathing treatments.  Will give her p.o. Decadron while here and reevaluate after second breathing treatment.  Respiratory panel shows***.     Final Clinical Impression(s) / ED Diagnoses Final diagnoses:  None    Rx / DC Orders ED Discharge Orders     None

## 2023-01-24 NOTE — Discharge Instructions (Addendum)
You were seen in the ER today for evaluation of your cough and cold symptoms.  Your x-ray was normal.  You tested negative for COVID, flu, RSV.  Likely this is exacerbation of your asthma.  You will need to stop smoking.  You will likely need to follow-up with primary care doctor as he may need to be put on a daily maintenance inhaler.  Please follow-up with your primary care doctor.  Reviewed all her see them, and included information for primary care practice in the area for you to call the scheduled appointment with.  I would like to use her albuterol inhaler as needed for your shortness of breath.  I also prescribed you a steroid burst for you to take daily for the next 5 days to help with your cough and cold symptoms.  Have also sent you in some Tessalon Perles which help with your cough.  Please make sure you are using your inhaler as prescribed.  You can also pick up a cool-mist humidifier with distilled water.  If you have any concerns, new or worsening symptoms, please turn to the nearest emergency room for evaluation.  Contact a doctor if: You have wheezing, shortness of breath, or a cough even while taking medicine to prevent attacks. The mucus you cough up (sputum) is thicker than usual. The mucus you cough up changes from clear or white to yellow, green, gray, or is bloody. You have problems from the medicine you are taking, such as: A rash. Itching. Swelling. Trouble breathing. You need reliever medicines more than 2-3 times a week. Your peak flow reading is still at 50-79% of your personal best after following the action plan for 1 hour. You have a fever. Get help right away if: You seem to be worse and are not responding to medicine during an asthma attack. You are short of breath even at rest. You get short of breath when doing very little activity. You have trouble eating, drinking, or talking. You have chest pain or tightness. You have a fast heartbeat. Your lips or  fingernails start to turn blue. You are light-headed or dizzy, or you faint. Your peak flow is less than 50% of your personal best. You feel too tired to breathe normally. These symptoms may be an emergency. Get help right away. Call 911. Do not wait to see if the symptoms will go away. Do not drive yourself to the hospital.

## 2023-01-24 NOTE — ED Notes (Signed)
Assist to bathroom via wheelchair x 3.tolerated standing.

## 2023-01-24 NOTE — ED Notes (Signed)
RT educated pt on asthma, pt states she has never seen a pulmonologist or had a PFT. RT suggested pt seek an appointment for further evaluation of her asthma/reactive airway for better relief of her ongoing symptoms. Pt was placed on cool mist aerosol for airway inflammation and cough. Pt respiratory status stable on RA w/no distress noted at this time. Pt verbalizes understanding.

## 2023-01-24 NOTE — ED Notes (Signed)
Patient educated on the use of a nebulizer breathing treatment and medication.

## 2023-01-24 NOTE — ED Triage Notes (Addendum)
Cough, chest pain, sob Started 3 days ago. Used home albuterol inhaler with little relief at home.  RT evaluated in triage

## 2023-02-08 ENCOUNTER — Emergency Department (HOSPITAL_BASED_OUTPATIENT_CLINIC_OR_DEPARTMENT_OTHER)
Admission: EM | Admit: 2023-02-08 | Discharge: 2023-02-08 | Disposition: A | Discharge status: Home / Self Care | Payer: Medicaid Other | Attending: Emergency Medicine | Admitting: Emergency Medicine

## 2023-02-08 ENCOUNTER — Other Ambulatory Visit: Payer: Self-pay

## 2023-02-08 DIAGNOSIS — Z1152 Encounter for screening for COVID-19: Secondary | ICD-10-CM | POA: Insufficient documentation

## 2023-02-08 DIAGNOSIS — J101 Influenza due to other identified influenza virus with other respiratory manifestations: Secondary | ICD-10-CM | POA: Insufficient documentation

## 2023-02-08 DIAGNOSIS — J45909 Unspecified asthma, uncomplicated: Secondary | ICD-10-CM | POA: Insufficient documentation

## 2023-02-08 LAB — RESP PANEL BY RT-PCR (RSV, FLU A&B, COVID)  RVPGX2
Influenza A by PCR: NEGATIVE
Influenza B by PCR: POSITIVE — AB
Resp Syncytial Virus by PCR: NEGATIVE
SARS Coronavirus 2 by RT PCR: NEGATIVE

## 2023-02-08 MED ORDER — BENZONATATE 100 MG PO CAPS: 100.0000 mg | ORAL_CAPSULE | Freq: Three times a day (TID) | ORAL | 0 refills | Status: AC

## 2023-02-08 NOTE — ED Provider Notes (Signed)
[place] Provider Note   CSN: [id number] Arrival date & time: 02/08/23  1126     History  Chief Complaint  Patient presents with   Cough   Nasal Congestion    Sheila Alvarez is a 32 y.o. female presented with 3 days of cough and nasal congestion.  Patient has a history of asthma and has been using her albuterol inhaler more than usual but denied any respiratory distress.  Patient endorsed chills and a dry cough.  Patient has tried Robitussin and Tylenol to no relief.  Patient is unable to tolerate food and fluids orally.  Patient denied chest pain, nausea/vomiting/diarrhea, fever, sick contacts, syncope, neck pain  Home Medications Prior to Admission medications   Medication Sig Start Date End Date Taking? Authorizing Provider  benzonatate (TESSALON) 100 MG capsule Take 1 capsule (100 mg total) by mouth every 8 (eight) hours. 02/08/23  Yes Mariyah Upshaw, Florene Route, PA-C  predniSONE (DELTASONE) 20 MG tablet Take 2 tablets (40 mg total) by mouth daily. 01/24/23   Sherrell Puller, PA-C      Allergies    Patient has no known allergies.    Review of Systems   Review of Systems  Respiratory:  Positive for cough.   See HPI  Physical Exam Updated Vital Signs BP 128/84 (BP Location: Right Arm)   Pulse 96   Temp 98.4 F (36.9 C) (Oral)   Resp 18   Ht 5' 6"$  (1.676 m)   Wt 136.1 kg   LMP 01/21/2023   SpO2 100%   BMI 48.42 kg/m  Physical Exam Constitutional:      General: She is not in acute distress. HENT:     Head: Normocephalic and atraumatic.     Right Ear: Tympanic membrane, ear canal and external ear normal.     Left Ear: Tympanic membrane, ear canal and external ear normal.     Nose: Congestion and rhinorrhea present.     Mouth/Throat:     Mouth: Mucous membranes are moist.     Pharynx: Posterior oropharyngeal erythema present.  Eyes:     Extraocular Movements: Extraocular movements intact.     Conjunctiva/sclera:  Conjunctivae normal.     Pupils: Pupils are equal, round, and reactive to light.  Cardiovascular:     Rate and Rhythm: Normal rate and regular rhythm.     Pulses: Normal pulses.     Heart sounds: Normal heart sounds.  Pulmonary:     Effort: Pulmonary effort is normal. No respiratory distress.     Breath sounds: Normal breath sounds.  Abdominal:     General: Abdomen is flat.     Palpations: Abdomen is soft.     Tenderness: There is no abdominal tenderness. There is no guarding or rebound.  Musculoskeletal:        General: Normal range of motion.     Cervical back: Normal range of motion and neck supple. No rigidity or tenderness.  Lymphadenopathy:     Cervical: No cervical adenopathy.  Skin:    General: Skin is warm.     Capillary Refill: Capillary refill takes less than 2 seconds.  Neurological:     General: No focal deficit present.     Mental Status: She is alert and oriented to person, place, and time.  Psychiatric:        Mood and Affect: Mood normal.     ED Results / Procedures / Treatments   Labs (all labs ordered are listed, but  only abnormal results are displayed) Labs Reviewed  RESP PANEL BY RT-PCR (RSV, FLU A&B, COVID)  RVPGX2 - Abnormal; Notable for the following components:      Result Value   Influenza B by PCR POSITIVE (*)    All other components within normal limits    EKG None  Radiology No results found.  Procedures Procedures    Medications Ordered in ED Medications - No data to display  ED Course/ Medical Decision Making/ A&P                             Medical Decision Making Risk Prescription drug management.   Sheila Alvarez 32 y.o. presented today for cough. Working DDx that I considered at this time includes, but not limited to, URI, pneumonia, meningitis, AOM, asthma exacerbation.  Review of prior external notes: None  Unique Tests and My Interpretation:  Strep panel: Positive for influenza B  Discussion with Independent  Historian: None  Discussion of Management of Tests: None  Risk:    Medium:  - prescription drug management  Risk Stratification Score: None  Staffed with Nanda Quinton, MD  R/o DDx: Meningitis: Patient is not a fever nor is confused, patient does not have neck stiffness Pneumonia: Duration of symptoms favor more viral illness, patient does not have a fever AOM: Bilateral tympanic membranes were not erythematous or or bulging Exacerbation: Patient was not wheezing on exam and in no respiratory distress  Plan: Presented with cough that impressive for 2 days.  Patient had generalized weakness on exam however her vitals are stable and she was not in any respiratory distress.  Patient is positive for influenza B.  Patient is outside the 48-hour window for Tamiflu however I will prescribe Tessalon to help manage the cough.  I spoke with the patient about supportive measures such as Tylenol/ibuprofen along with cough drops, honey, hot tea/water.  Patient was given return precautions.  Patient stable for discharge at this time.  Patient verbalized agreement to this plan.        Final Clinical Impression(s) / ED Diagnoses Final diagnoses:  Influenza B    Rx / DC Orders ED Discharge Orders          Ordered    benzonatate (TESSALON) 100 MG capsule  Every 8 hours        02/08/23 1319              Elvina Sidle 02/08/23 1326    Long, Wonda Olds, MD 02/11/23 1023

## 2023-02-08 NOTE — Discharge Instructions (Signed)
I Have attached a work note for you for the week.  Please take in plenty of fluids and food as tolerated.  I recommend a bland diet to avoid upsetting your stomach.  I have also prescribed for you on anticough medication please take as prescribed.  You may alternate every 6 hours as needed for pain between 1000 mg Tylenol and 400 mg ibuprofen.  You may also try cough drops, honey, hot tea/water for symptom management.  Symptoms worsen such as you have a fever that is not being treated with the Tylenol and ibuprofen, using her albuterol and still feeling short of breath please return to ER.

## 2023-05-30 DIAGNOSIS — Z419 Encounter for procedure for purposes other than remedying health state, unspecified: Secondary | ICD-10-CM | POA: Diagnosis not present

## 2023-06-29 DIAGNOSIS — Z419 Encounter for procedure for purposes other than remedying health state, unspecified: Secondary | ICD-10-CM | POA: Diagnosis not present

## 2023-07-30 DIAGNOSIS — Z419 Encounter for procedure for purposes other than remedying health state, unspecified: Secondary | ICD-10-CM | POA: Diagnosis not present

## 2023-08-30 DIAGNOSIS — Z419 Encounter for procedure for purposes other than remedying health state, unspecified: Secondary | ICD-10-CM | POA: Diagnosis not present

## 2023-09-29 DIAGNOSIS — Z419 Encounter for procedure for purposes other than remedying health state, unspecified: Secondary | ICD-10-CM | POA: Diagnosis not present

## 2023-10-30 DIAGNOSIS — Z419 Encounter for procedure for purposes other than remedying health state, unspecified: Secondary | ICD-10-CM | POA: Diagnosis not present

## 2023-11-29 DIAGNOSIS — Z419 Encounter for procedure for purposes other than remedying health state, unspecified: Secondary | ICD-10-CM | POA: Diagnosis not present

## 2023-12-06 ENCOUNTER — Other Ambulatory Visit: Payer: Self-pay

## 2023-12-06 ENCOUNTER — Emergency Department (HOSPITAL_BASED_OUTPATIENT_CLINIC_OR_DEPARTMENT_OTHER)
Admission: EM | Admit: 2023-12-06 | Discharge: 2023-12-06 | Disposition: A | Discharge status: Home / Self Care | Payer: Medicaid Other | Attending: Emergency Medicine | Admitting: Emergency Medicine

## 2023-12-06 ENCOUNTER — Encounter (HOSPITAL_BASED_OUTPATIENT_CLINIC_OR_DEPARTMENT_OTHER): Payer: Self-pay | Admitting: Emergency Medicine

## 2023-12-06 ENCOUNTER — Emergency Department (HOSPITAL_BASED_OUTPATIENT_CLINIC_OR_DEPARTMENT_OTHER): Payer: Medicaid Other

## 2023-12-06 DIAGNOSIS — Z20822 Contact with and (suspected) exposure to covid-19: Secondary | ICD-10-CM | POA: Diagnosis not present

## 2023-12-06 DIAGNOSIS — J45909 Unspecified asthma, uncomplicated: Secondary | ICD-10-CM | POA: Diagnosis not present

## 2023-12-06 DIAGNOSIS — J205 Acute bronchitis due to respiratory syncytial virus: Secondary | ICD-10-CM | POA: Diagnosis not present

## 2023-12-06 DIAGNOSIS — R918 Other nonspecific abnormal finding of lung field: Secondary | ICD-10-CM | POA: Diagnosis not present

## 2023-12-06 DIAGNOSIS — Z7952 Long term (current) use of systemic steroids: Secondary | ICD-10-CM | POA: Diagnosis not present

## 2023-12-06 DIAGNOSIS — R059 Cough, unspecified: Secondary | ICD-10-CM | POA: Diagnosis not present

## 2023-12-06 LAB — RESP PANEL BY RT-PCR (RSV, FLU A&B, COVID)  RVPGX2
Influenza A by PCR: NEGATIVE
Influenza B by PCR: NEGATIVE
Resp Syncytial Virus by PCR: POSITIVE — AB
SARS Coronavirus 2 by RT PCR: NEGATIVE

## 2023-12-06 MED ORDER — ACETAMINOPHEN 500 MG PO TABS
1000.0000 mg | ORAL_TABLET | Freq: Once | ORAL | Status: AC
  Administered 2023-12-06: 1000 mg via ORAL
  Filled 2023-12-06: qty 2

## 2023-12-06 NOTE — Discharge Instructions (Addendum)
You tested positive for RSV.  This is a respiratory virus.  Your cough should improve over the next few days, but nasal congestion will likely continue for the next 1 to 2 weeks.  You can take over-the-counter medications, Motrin and Tylenol.  Keep using your inhaler as needed.  Follow-up with your primary care doctor.

## 2023-12-06 NOTE — ED Triage Notes (Signed)
Day 3 of dry cough,cold chills, difficulty breathingk(pt does have asthma),no appetite

## 2023-12-06 NOTE — ED Triage Notes (Signed)
Pt called by nurse Marylene Land, no answer, may be in the bathroom

## 2023-12-06 NOTE — ED Provider Notes (Signed)
[place] EMERGENCY DEPARTMENT AT [facility] Provider Note   CSN: [id number] Arrival date & time: 12/06/23  1049     History  No chief complaint on file.   Sheila Alvarez is a 32 y.o. female.  32 year old female with history of asthma here today for cough and congestion over the last few days.        Home Medications Prior to Admission medications   Medication Sig Start Date End Date Taking? Authorizing Provider  benzonatate (TESSALON) 100 MG capsule Take 1 capsule (100 mg total) by mouth every 8 (eight) hours. 02/08/23   Netta Corrigan, PA-C  predniSONE (DELTASONE) 20 MG tablet Take 2 tablets (40 mg total) by mouth daily. 01/24/23   Achille Rich, PA-C      Allergies    Patient has no known allergies.    Review of Systems   Review of Systems  Physical Exam Updated Vital Signs BP (!) 155/93 (BP Location: Right Arm)   Pulse (!) 104   Temp 98.1 F (36.7 C) (Oral)   Resp 19   Wt (!) 142.4 kg   SpO2 97%   BMI 50.68 kg/m  Physical Exam Vitals reviewed.  Constitutional:      Appearance: Normal appearance.  HENT:     Head: Normocephalic and atraumatic.  Cardiovascular:     Rate and Rhythm: Normal rate.  Pulmonary:     Effort: Pulmonary effort is normal. No respiratory distress.     Breath sounds: No wheezing.  Abdominal:     General: Abdomen is flat.     Palpations: Abdomen is soft.  Skin:    General: Skin is warm and dry.  Neurological:     General: No focal deficit present.     Mental Status: She is alert.     Cranial Nerves: No cranial nerve deficit.     Motor: No weakness.     ED Results / Procedures / Treatments   Labs (all labs ordered are listed, but only abnormal results are displayed) Labs Reviewed  RESP PANEL BY RT-PCR (RSV, FLU A&B, COVID)  RVPGX2 - Abnormal; Notable for the following components:      Result Value   Resp Syncytial Virus by PCR POSITIVE (*)    All other components within normal limits     EKG None  Radiology DG Chest 1 View  Result Date: 12/06/2023 CLINICAL DATA:  Cough. EXAM: CHEST  1 VIEW COMPARISON:  01/24/2023. FINDINGS: Cardiac silhouette is unremarkable. No pneumothorax or pleural effusion. Bibasilar prominence of the pulmonary interstitium consistent with edema, inflammation or scarring. Osseous structures grossly unremarkable. IMPRESSION: Bibasilar interstitial prominence consistent with scarring, edema or subsegmental atelectasis. Electronically Signed   By: Layla Maw M.D.   On: 12/06/2023 12:07    Procedures Procedures    Medications Ordered in ED Medications  acetaminophen (TYLENOL) tablet 1,000 mg (has no administration in time range)    ED Course/ Medical Decision Making/ A&P                                 Medical Decision Making 32 year old female here today for cough and congestion.  Differential diagnose include viral syndrome, slightly pneumonia.  Plan-patient viral swab was positive for RSV.  My independent review the patient's chest x-ray shows no pneumonia.  Patient not tachypneic, lung sounds clear.  Will discharge with PCP follow-up.  Amount and/or Complexity of Data Reviewed Radiology: ordered.  Risk OTC  drugs.           Final Clinical Impression(s) / ED Diagnoses Final diagnoses:  RSV bronchitis    Rx / DC Orders ED Discharge Orders     None         Anders Simmonds T, DO 12/06/23 1213

## 2023-12-30 DIAGNOSIS — Z419 Encounter for procedure for purposes other than remedying health state, unspecified: Secondary | ICD-10-CM | POA: Diagnosis not present

## 2024-01-30 DIAGNOSIS — Z419 Encounter for procedure for purposes other than remedying health state, unspecified: Secondary | ICD-10-CM | POA: Diagnosis not present

## 2024-02-27 DIAGNOSIS — Z419 Encounter for procedure for purposes other than remedying health state, unspecified: Secondary | ICD-10-CM | POA: Diagnosis not present

## 2024-04-09 DIAGNOSIS — Z419 Encounter for procedure for purposes other than remedying health state, unspecified: Secondary | ICD-10-CM | POA: Diagnosis not present

## 2024-05-09 DIAGNOSIS — Z419 Encounter for procedure for purposes other than remedying health state, unspecified: Secondary | ICD-10-CM | POA: Diagnosis not present

## 2024-06-09 DIAGNOSIS — Z419 Encounter for procedure for purposes other than remedying health state, unspecified: Secondary | ICD-10-CM | POA: Diagnosis not present

## 2024-06-22 ENCOUNTER — Ambulatory Visit (HOSPITAL_BASED_OUTPATIENT_CLINIC_OR_DEPARTMENT_OTHER): Admitting: Orthopaedic Surgery

## 2024-07-09 DIAGNOSIS — Z419 Encounter for procedure for purposes other than remedying health state, unspecified: Secondary | ICD-10-CM | POA: Diagnosis not present

## 2024-07-15 ENCOUNTER — Encounter: Payer: Self-pay | Admitting: Advanced Practice Midwife

## 2024-07-27 ENCOUNTER — Encounter: Admitting: Obstetrics and Gynecology

## 2024-08-09 DIAGNOSIS — Z419 Encounter for procedure for purposes other than remedying health state, unspecified: Secondary | ICD-10-CM | POA: Diagnosis not present

## 2024-09-06 ENCOUNTER — Ambulatory Visit: Admitting: Family Medicine

## 2024-09-09 ENCOUNTER — Ambulatory Visit: Payer: Self-pay | Admitting: Podiatry

## 2024-09-09 DIAGNOSIS — Z419 Encounter for procedure for purposes other than remedying health state, unspecified: Secondary | ICD-10-CM | POA: Diagnosis not present

## 2024-09-13 ENCOUNTER — Telehealth: Payer: Self-pay | Admitting: Podiatry

## 2024-09-13 NOTE — Telephone Encounter (Signed)
 Called and left message to confirm appointment cancellation Stated if no contact by EOD would go ahead and cancel.

## 2024-09-14 ENCOUNTER — Ambulatory Visit: Admitting: Podiatry

## 2024-09-28 ENCOUNTER — Ambulatory Visit: Admitting: Orthopaedic Surgery

## 2024-10-31 ENCOUNTER — Encounter: Payer: Self-pay | Admitting: Radiology

## 2024-12-03 ENCOUNTER — Emergency Department (HOSPITAL_BASED_OUTPATIENT_CLINIC_OR_DEPARTMENT_OTHER): Admitting: Radiology

## 2024-12-03 ENCOUNTER — Other Ambulatory Visit: Payer: Self-pay

## 2024-12-03 ENCOUNTER — Emergency Department (HOSPITAL_BASED_OUTPATIENT_CLINIC_OR_DEPARTMENT_OTHER)
Admission: EM | Admit: 2024-12-03 | Discharge: 2024-12-03 | Disposition: A | Discharge status: Home / Self Care | Attending: Emergency Medicine | Admitting: Emergency Medicine

## 2024-12-03 ENCOUNTER — Other Ambulatory Visit (HOSPITAL_BASED_OUTPATIENT_CLINIC_OR_DEPARTMENT_OTHER): Payer: Self-pay

## 2024-12-03 ENCOUNTER — Encounter (HOSPITAL_BASED_OUTPATIENT_CLINIC_OR_DEPARTMENT_OTHER): Payer: Self-pay

## 2024-12-03 DIAGNOSIS — R059 Cough, unspecified: Secondary | ICD-10-CM | POA: Diagnosis not present

## 2024-12-03 DIAGNOSIS — R0602 Shortness of breath: Secondary | ICD-10-CM | POA: Diagnosis not present

## 2024-12-03 DIAGNOSIS — J45901 Unspecified asthma with (acute) exacerbation: Secondary | ICD-10-CM | POA: Diagnosis not present

## 2024-12-03 DIAGNOSIS — Z7951 Long term (current) use of inhaled steroids: Secondary | ICD-10-CM | POA: Diagnosis not present

## 2024-12-03 DIAGNOSIS — R051 Acute cough: Secondary | ICD-10-CM

## 2024-12-03 DIAGNOSIS — E119 Type 2 diabetes mellitus without complications: Secondary | ICD-10-CM | POA: Insufficient documentation

## 2024-12-03 LAB — BASIC METABOLIC PANEL WITH GFR
Anion gap: 12 (ref 5–15)
BUN: 7 mg/dL (ref 6–20)
CO2: 24 mmol/L (ref 22–32)
Calcium: 9.7 mg/dL (ref 8.9–10.3)
Chloride: 101 mmol/L (ref 98–111)
Creatinine, Ser: 0.71 mg/dL (ref 0.44–1.00)
GFR, Estimated: >60 mL/min (ref >60)
Glucose, Bld: 106 mg/dL — ABNORMAL HIGH (ref 70–99)
Potassium: 4.1 mmol/L (ref 3.5–5.1)
Sodium: 136 mmol/L (ref 135–145)

## 2024-12-03 LAB — CBC
HCT: 37.8 % (ref 36.0–46.0)
Hemoglobin: 12.7 g/dL (ref 12.0–15.0)
MCH: 23.9 pg — ABNORMAL LOW (ref 26.0–34.0)
MCHC: 33.6 g/dL (ref 30.0–36.0)
MCV: 71.2 fL — ABNORMAL LOW (ref 80.0–100.0)
Platelets: 337 K/uL (ref 150–400)
RBC: 5.31 MIL/uL — ABNORMAL HIGH (ref 3.87–5.11)
RDW: 15.6 % — ABNORMAL HIGH (ref 11.5–15.5)
WBC: 10.5 K/uL (ref 4.0–10.5)
nRBC: 0 % (ref 0.0–0.2)

## 2024-12-03 LAB — RESP PANEL BY RT-PCR (RSV, FLU A&B, COVID)  RVPGX2
Influenza A by PCR: NEGATIVE
Influenza B by PCR: NEGATIVE
Resp Syncytial Virus by PCR: NEGATIVE
SARS Coronavirus 2 by RT PCR: NEGATIVE

## 2024-12-03 LAB — HCG, SERUM, QUALITATIVE: Preg, Serum: NEGATIVE

## 2024-12-03 LAB — D-DIMER, QUANTITATIVE: D-Dimer, Quant: 0.39 ug{FEU}/mL (ref 0.00–0.50)

## 2024-12-03 LAB — TROPONIN T, HIGH SENSITIVITY: Troponin T High Sensitivity: <15 ng/L (ref 0–19)

## 2024-12-03 MED ORDER — IPRATROPIUM-ALBUTEROL 0.5-2.5 (3) MG/3ML IN SOLN
RESPIRATORY_TRACT | Status: DC
  Filled 2024-12-03: qty 3

## 2024-12-03 MED ORDER — METHYLPREDNISOLONE SODIUM SUCC 125 MG IJ SOLR
125.0000 mg | Freq: Once | INTRAMUSCULAR | Status: AC
  Administered 2024-12-03: 125 mg via INTRAVENOUS
  Filled 2024-12-03: qty 2

## 2024-12-03 MED ORDER — IPRATROPIUM-ALBUTEROL 0.5-2.5 (3) MG/3ML IN SOLN
3.0000 mL | Freq: Once | RESPIRATORY_TRACT | Status: AC
  Administered 2024-12-03: 3 mL via RESPIRATORY_TRACT

## 2024-12-03 MED ORDER — MAGNESIUM SULFATE 2 GM/50ML IV SOLN
2.0000 g | Freq: Once | INTRAVENOUS | Status: AC
  Administered 2024-12-03: 2 g via INTRAVENOUS
  Filled 2024-12-03: qty 50

## 2024-12-03 MED ORDER — KETOROLAC TROMETHAMINE 15 MG/ML IJ SOLN
15.0000 mg | Freq: Once | INTRAMUSCULAR | Status: AC
  Administered 2024-12-03: 15 mg via INTRAVENOUS
  Filled 2024-12-03: qty 1

## 2024-12-03 MED ORDER — ALBUTEROL SULFATE HFA 108 (90 BASE) MCG/ACT IN AERS
1.0000 | INHALATION_SPRAY | Freq: Four times a day (QID) | RESPIRATORY_TRACT | 0 refills | Status: AC | PRN
  Filled 2024-12-03: qty 6.7, 25d supply, fill #0

## 2024-12-03 MED ORDER — PREDNISONE 20 MG PO TABS
40.0000 mg | ORAL_TABLET | Freq: Every day | ORAL | 0 refills | Status: AC
  Filled 2024-12-03: qty 6, 3d supply, fill #0

## 2024-12-03 MED ORDER — IPRATROPIUM-ALBUTEROL 0.5-2.5 (3) MG/3ML IN SOLN
3.0000 mL | Freq: Once | RESPIRATORY_TRACT | Status: AC
  Administered 2024-12-03: 3 mL via RESPIRATORY_TRACT
  Filled 2024-12-03: qty 3

## 2024-12-03 NOTE — ED Triage Notes (Signed)
 Arrives ambulatory to the ED with complaints of worsening shortness of breath and cough x1 day. Also reports a hx of asthma.

## 2024-12-03 NOTE — ED Notes (Signed)
 Reviewed AVS/discharge instruction with patient. Time allotted for and all questions answered. Patient is agreeable for d/c and escorted to ed exit by staff.

## 2024-12-03 NOTE — ED Notes (Signed)
 Ambulation on room air per MD order: 93%-96% during walk, pt appears SOB, HR 125-126.

## 2024-12-03 NOTE — ED Notes (Signed)
 Patient given discharge instructions. Questions were answered. Patient verbalized understanding of discharge instructions and care at home.

## 2024-12-03 NOTE — Discharge Instructions (Signed)
 As we discussed, I would like for you to follow-up with a primary care doctor.  I have provided number you can call to establish care.  I have refilled your albuterol  inhaler.  I am unable to write for nebulizer machine at this time.  However, primary care can fill this.  I have also given you steroids.  Please take as prescribed.  Return to the emergency department for any worsening symptoms.

## 2024-12-03 NOTE — ED Provider Notes (Signed)
 [place] EMERGENCY DEPARTMENT AT [facility] Provider Note   CSN: [id number] Arrival date & time: 12/03/24  1030     Patient presents with: Cough and Shortness of Breath   Sheila Alvarez is a 33 y.o. female patient with history of asthma and diabetes who presents to the emergency department today for further evaluation of shortness of breath and cough.  Symptoms been present for the last couple of days.  Patient does not have any maintenance medication for asthma apart from an rescue albuterol  inhaler.  She has been using this with little relief.  She notes that her cough has been productive with yellow sputum.  She denies any fever, chills, abdominal pain, nausea, vomiting.  Does endorse some chest pain after coughing and chest tightness.  She states this feels like an asthma exacerbation.    Cough Associated symptoms: shortness of breath   Shortness of Breath Associated symptoms: cough        Prior to Admission medications   Medication Sig Start Date End Date Taking? Authorizing Provider  albuterol  (VENTOLIN  HFA) 108 (90 Base) MCG/ACT inhaler Inhale 1-2 puffs into the lungs every 6 (six) hours as needed for wheezing or shortness of breath. 12/03/24  Yes Theotis, Charlet Harr M, PA-C  predniSONE  (DELTASONE ) 20 MG tablet Take 2 tablets (40 mg total) by mouth daily. 12/03/24  Yes Theotis, Jniyah Dantuono M, PA-C  benzonatate  (TESSALON ) 100 MG capsule Take 1 capsule (100 mg total) by mouth every 8 (eight) hours. 02/08/23   Victor Lynwood DASEN, PA-C  predniSONE  (DELTASONE ) 20 MG tablet Take 2 tablets (40 mg total) by mouth daily. 01/24/23   Bernis Ernst, PA-C    Allergies: Patient has no known allergies.    Review of Systems  Respiratory:  Positive for cough and shortness of breath.   All other systems reviewed and are negative.   Updated Vital Signs BP (!) 146/72   Pulse (!) 111   Temp 98.8 F (37.1 C) (Oral)   Resp (!) 22   Ht 5' 6 (1.676 m)   Wt (!) 142.4 kg   SpO2 95%   BMI  50.68 kg/m   Physical Exam Vitals and nursing note reviewed.  Constitutional:      Appearance: Normal appearance.     Comments: Mild distress  HENT:     Head: Normocephalic and atraumatic.  Eyes:     General:        Right eye: No discharge.        Left eye: No discharge.  Cardiovascular:     Comments: Regular rate and rhythm.  S1/S2 are distinct without any evidence of murmur, rubs, or gallops.  Radial pulses are 2+ bilaterally.  Dorsalis pedis pulses are 2+ bilaterally.  No evidence of pedal edema. Pulmonary:     Effort: Tachypnea present.     Comments: Mild respiratory distress.  Moderate diffuse expiratory wheezing heard throughout the precordium Abdominal:     General: Abdomen is flat. Bowel sounds are normal. There is no distension.     Tenderness: There is no abdominal tenderness. There is no guarding or rebound.  Musculoskeletal:        General: Normal range of motion.     Cervical back: Neck supple.  Skin:    General: Skin is warm and dry.     Findings: No rash.  Neurological:     General: No focal deficit present.     Mental Status: She is alert.  Psychiatric:  Mood and Affect: Mood normal.        Behavior: Behavior normal.     (all labs ordered are listed, but only abnormal results are displayed) Labs Reviewed  BASIC METABOLIC PANEL WITH GFR - Abnormal; Notable for the following components:      Result Value   Glucose, Bld 106 (*)    All other components within normal limits  CBC - Abnormal; Notable for the following components:   RBC 5.31 (*)    MCV 71.2 (*)    MCH 23.9 (*)    RDW 15.6 (*)    All other components within normal limits  RESP PANEL BY RT-PCR (RSV, FLU A&B, COVID)  RVPGX2  HCG, SERUM, QUALITATIVE  D-DIMER, QUANTITATIVE  TROPONIN T, HIGH SENSITIVITY  TROPONIN T, HIGH SENSITIVITY    EKG: EKG Interpretation Date/Time:  Saturday December 03 2024 12:33:21 EST Ventricular Rate:  115 PR Interval:  145 QRS Duration:  97 QT  Interval:  323 QTC Calculation: 447 R Axis:   54  Text Interpretation: Sinus tachycardia Probable left atrial enlargement Borderline repolarization abnormality No acute changes No significant change since last tracing Confirmed by Charlyn Sora ([id number]) on 12/03/2024 12:53:50 PM  Radiology: DG Chest 2 View Result Date: 12/03/2024 EXAM: 2 VIEW(S) XRAY OF THE CHEST 12/03/2024 11:18:36 AM COMPARISON: 12/06/2023 CLINICAL HISTORY: cough and shortness of breath FINDINGS: LUNGS AND PLEURA: No focal pulmonary opacity. No pleural effusion. No pneumothorax. HEART AND MEDIASTINUM: No acute abnormality of the cardiac and mediastinal silhouettes. BONES AND SOFT TISSUES: No acute osseous abnormality. IMPRESSION: 1. No acute cardiopulmonary process to explain shortness of breath. Electronically signed by: Waddell Calk MD 12/03/2024 11:22 AM EST RP Workstation: [id number]     .Critical Care  Performed by: Theotis Cameron HERO, PA-C Authorized by: Theotis Cameron HERO, PA-C   Critical care provider statement:    Critical care time (minutes):  35   Critical care time was exclusive of:  Separately billable procedures and treating other patients   Critical care was necessary to treat or prevent imminent or life-threatening deterioration of the following conditions:  Respiratory failure   Critical care was time spent personally by me on the following activities:  Blood draw for specimens, development of treatment plan with patient or surrogate, ordering and performing treatments and interventions, ordering and review of laboratory studies, ordering and review of radiographic studies, pulse oximetry and re-evaluation of patient's condition    Medications Ordered in the ED  ipratropium-albuterol  (DUONEB) 0.5-2.5 (3) MG/3ML nebulizer solution (  Canceled Entry 12/03/24 1045)  ipratropium-albuterol  (DUONEB) 0.5-2.5 (3) MG/3ML nebulizer solution 3 mL (3 mLs Nebulization Given 12/03/24 1042)  methylPREDNISolone  sodium  succinate (SOLU-MEDROL ) 125 mg/2 mL injection 125 mg (125 mg Intravenous Given 12/03/24 1128)  ketorolac  (TORADOL ) 15 MG/ML injection 15 mg (15 mg Intravenous Given 12/03/24 1200)  ipratropium-albuterol  (DUONEB) 0.5-2.5 (3) MG/3ML nebulizer solution 3 mL (3 mLs Nebulization Given 12/03/24 1206)  magnesium  sulfate IVPB 2 g 50 mL (0 g Intravenous Stopped 12/03/24 1330)    Clinical Course as of 12/03/24 1351  Sat Dec 03, 2024  1137 hCG, serum, qualitative Negative. [CF]  1137 CBC(!) Negative. [CF]  1137 Basic metabolic panel(!) Negative. [CF]  1137 Resp panel by RT-PCR (RSV, Flu A&B, Covid) Anterior Nasal Swab Negative. [CF]  1137 DG Chest 2 View No signs of pneumonia.  I do agree with radiologist interpretation. [CF]  1142 On reevaluation, patient states she is feeling better.  She does have a headache which I think is likely  from the DuoNeb treatments.  Tachypnea has improved but she is slightly tachycardic likely from albuterol .  Plan to ambulate with a pulse ox to look for any signs of desaturation.  If she can maintain O2 sats will likely plan to discharge home. [CF]  1345 On repeat evaluation, while patient was walking she did have increased amount of tachycardia but her O2 saturations remained the same.  She was having increased work of breathing.  I added on a D-dimer and a single troponin which were both normal.  I also gave her 2 g of magnesium  which seemed to improve her symptoms.  Patient wanting to go home at this time feeling is totally appropriate.  Patient does not have a primary care doctor and I will refer her to [facility].  Will also refill her albuterol  prescription.  I will also give her a very short dose of steroids follow-up. Patient agreeable with plan. [CF]    Clinical Course User Index [CF] Theotis Cameron HERO, PA-C    Medical Decision Making This patient presents to the ED for concern of shortness of breath and cough, this involves an  extensive number of treatment options, and is a complaint that carries with it a high risk of complications and morbidity.  The differential diagnosis includes asthma exacerbation, pneumonia, bronchitis, PE although lower on differential given wheezing and lack of other positive history, low suspicion for ACS as this seems more respiratory dependent.   Co morbidities that complicate the patient evaluation  Diabetes and asthma.   Additional history obtained:  Additional history and/or information obtained from chart review, notable for RSV infection earlier this year.   Lab Tests:  I Ordered, and personally interpreted labs.  The pertinent results include: See workup tab.   Imaging Studies ordered:  I ordered imaging studies including chest x-ray. I independently visualized and interpreted imaging which showed normal I agree with the radiologist interpretation   Cardiac Monitoring:  The patient was maintained on a cardiac monitor.  I personally viewed and interpreted the cardiac monitored which showed an underlying rhythm of: Tachycardia but regular rhythm.   Medicines ordered and prescription drug management:  I ordered medication including DuoNebs, magnesium , steroids for asthma exacerbation Reevaluation of the patient after these medicines showed that the patient improved I have reviewed the patients home medicines and have made adjustments as needed   Test Considered:  None   Critical Interventions:  Nebulizer treatments, steroids, magnesium    Consultations Obtained:  None   Problem List / ED Course:  See clinical course   Reevaluation:  After the interventions noted above, I reevaluated the patient and found that they have :improved   Social Determinants of Health:  Asthma   Disposition:  After consideration of the diagnostic results and the patients response to treatment, I feel that the patient would benefit from discharge home with strict  return precautions.   Amount and/or Complexity of Data Reviewed Labs: ordered. Decision-making details documented in ED Course. Radiology: ordered. Decision-making details documented in ED Course.  Risk Prescription drug management.     Final diagnoses:  Acute cough  Moderate asthma with exacerbation, unspecified whether persistent    ED Discharge Orders          Ordered    predniSONE  (DELTASONE ) 20 MG tablet  Daily        12/03/24 1344    albuterol  (VENTOLIN  HFA) 108 (90 Base) MCG/ACT inhaler  Every 6 hours PRN  12/03/24 1344               Theotis Peers [place], PA-C 12/03/24 1351    Charlyn Sora, MD 12/04/24 [phone]

## 2024-12-03 NOTE — ED Notes (Signed)
 Patient transported to X-ray

## 2024-12-05 ENCOUNTER — Encounter (HOSPITAL_BASED_OUTPATIENT_CLINIC_OR_DEPARTMENT_OTHER): Admitting: Certified Nurse Midwife

## 2024-12-09 DIAGNOSIS — Z419 Encounter for procedure for purposes other than remedying health state, unspecified: Secondary | ICD-10-CM | POA: Diagnosis not present
